# Patient Record
Sex: Male | Born: 1965
Health system: Southern US, Community
[De-identification: ages and names within clinical notes are randomized; demographics above are authoritative.]

## PROBLEM LIST (undated history)

## (undated) DIAGNOSIS — A4902 Methicillin resistant Staphylococcus aureus infection, unspecified site: Secondary | ICD-10-CM

## (undated) DIAGNOSIS — E785 Hyperlipidemia, unspecified: Secondary | ICD-10-CM

## (undated) DIAGNOSIS — I10 Essential (primary) hypertension: Secondary | ICD-10-CM

---

## 2000-08-01 ENCOUNTER — Encounter: Payer: Self-pay | Admitting: Emergency Medicine

## 2000-08-01 ENCOUNTER — Emergency Department (HOSPITAL_COMMUNITY): Admission: EM | Admit: 2000-08-01 | Discharge: 2000-08-01 | Payer: Self-pay | Admitting: Emergency Medicine

## 2005-02-06 ENCOUNTER — Emergency Department (HOSPITAL_COMMUNITY): Admission: EM | Admit: 2005-02-06 | Discharge: 2005-02-06 | Payer: Self-pay | Admitting: Emergency Medicine

## 2005-04-20 ENCOUNTER — Ambulatory Visit: Payer: Self-pay | Admitting: Physical Medicine & Rehabilitation

## 2005-04-20 ENCOUNTER — Inpatient Hospital Stay (HOSPITAL_COMMUNITY): Admission: EM | Admit: 2005-04-20 | Discharge: 2005-04-27 | Payer: Self-pay | Admitting: Emergency Medicine

## 2005-11-06 ENCOUNTER — Ambulatory Visit: Payer: Self-pay | Admitting: Family Medicine

## 2006-02-11 ENCOUNTER — Ambulatory Visit: Payer: Self-pay | Admitting: Family Medicine

## 2006-06-24 ENCOUNTER — Emergency Department (HOSPITAL_COMMUNITY): Admission: EM | Admit: 2006-06-24 | Discharge: 2006-06-24 | Payer: Self-pay | Admitting: Family Medicine

## 2006-06-28 ENCOUNTER — Ambulatory Visit: Payer: Self-pay | Admitting: Family Medicine

## 2006-07-01 ENCOUNTER — Emergency Department: Payer: Self-pay | Admitting: Emergency Medicine

## 2006-07-05 ENCOUNTER — Ambulatory Visit (HOSPITAL_COMMUNITY): Admission: RE | Admit: 2006-07-05 | Discharge: 2006-07-06 | Payer: Self-pay | Admitting: Neurosurgery

## 2006-08-02 ENCOUNTER — Emergency Department (HOSPITAL_COMMUNITY): Admission: EM | Admit: 2006-08-02 | Discharge: 2006-08-02 | Payer: Self-pay | Admitting: Family Medicine

## 2006-09-27 ENCOUNTER — Ambulatory Visit: Payer: Self-pay | Admitting: Psychiatry

## 2006-09-27 ENCOUNTER — Inpatient Hospital Stay (HOSPITAL_COMMUNITY): Admission: RE | Admit: 2006-09-27 | Discharge: 2006-10-01 | Payer: Self-pay | Admitting: Psychiatry

## 2006-10-06 ENCOUNTER — Emergency Department (HOSPITAL_COMMUNITY): Admission: EM | Admit: 2006-10-06 | Discharge: 2006-10-06 | Payer: Self-pay | Admitting: Emergency Medicine

## 2006-12-15 ENCOUNTER — Emergency Department (HOSPITAL_COMMUNITY): Admission: EM | Admit: 2006-12-15 | Discharge: 2006-12-15 | Payer: Self-pay | Admitting: *Deleted

## 2008-06-26 ENCOUNTER — Emergency Department (HOSPITAL_COMMUNITY): Admission: EM | Admit: 2008-06-26 | Discharge: 2008-06-26 | Payer: Self-pay | Admitting: Emergency Medicine

## 2008-07-30 ENCOUNTER — Emergency Department (HOSPITAL_COMMUNITY): Admission: EM | Admit: 2008-07-30 | Discharge: 2008-07-30 | Payer: Self-pay | Admitting: Emergency Medicine

## 2008-07-30 ENCOUNTER — Ambulatory Visit: Payer: Self-pay | Admitting: *Deleted

## 2008-07-30 ENCOUNTER — Inpatient Hospital Stay (HOSPITAL_COMMUNITY): Admission: RE | Admit: 2008-07-30 | Discharge: 2008-08-06 | Payer: Self-pay | Admitting: Psychiatry

## 2009-08-31 ENCOUNTER — Emergency Department (HOSPITAL_COMMUNITY): Admission: EM | Admit: 2009-08-31 | Discharge: 2009-08-31 | Payer: Self-pay | Admitting: Emergency Medicine

## 2010-08-05 LAB — CULTURE, ROUTINE-ABSCESS: Gram Stain: NONE SEEN

## 2010-08-28 LAB — COMPREHENSIVE METABOLIC PANEL
ALT: 42 U/L (ref 0–53)
CO2: 25 mEq/L (ref 19–32)
Calcium: 9.5 mg/dL (ref 8.4–10.5)
Creatinine, Ser: 0.97 mg/dL (ref 0.4–1.5)
GFR calc non Af Amer: >60 mL/min (ref >60)
Glucose, Bld: 94 mg/dL (ref 70–99)
Total Bilirubin: 0.8 mg/dL (ref 0.3–1.2)

## 2010-08-28 LAB — DIFFERENTIAL
Basophils Absolute: 0 10*3/uL (ref 0.0–0.1)
Eosinophils Absolute: 0.1 10*3/uL (ref 0.0–0.7)
Lymphocytes Relative: 24 % (ref 12–46)
Lymphs Abs: 1.9 10*3/uL (ref 0.7–4.0)
Neutrophils Relative %: 67 % (ref 43–77)

## 2010-08-28 LAB — CBC
HCT: 49.9 % (ref 39.0–52.0)
Hemoglobin: 17.3 g/dL — ABNORMAL HIGH (ref 13.0–17.0)
MCHC: 34.7 g/dL (ref 30.0–36.0)
MCV: 95.4 fL (ref 78.0–100.0)
RBC: 5.24 MIL/uL (ref 4.22–5.81)

## 2010-08-28 LAB — RAPID URINE DRUG SCREEN, HOSP PERFORMED
Cocaine: NOT DETECTED
Tetrahydrocannabinol: NOT DETECTED

## 2010-08-28 LAB — URINALYSIS, ROUTINE W REFLEX MICROSCOPIC
Nitrite: NEGATIVE
Urobilinogen, UA: 1 mg/dL (ref 0.0–1.0)
pH: 6 (ref 5.0–8.0)

## 2010-08-28 LAB — PHENYTOIN LEVEL, TOTAL: Phenytoin Lvl: 2.9 ug/mL — ABNORMAL LOW (ref 10.0–20.0)

## 2010-09-02 LAB — GLUCOSE, CAPILLARY: Glucose-Capillary: 140 mg/dL — ABNORMAL HIGH (ref 70–99)

## 2010-09-30 NOTE — H&P (Signed)
NAMEPELLEGRINO, Brandon [name]             ACCOUNT NO.:  [id number]   MEDICAL RECORD NO.:  [id number]          PATIENT TYPE:  IPS   LOCATION:  0506                          [facility]ACILITY[facility]  [facility]   PHYSICIAN:  Geoffery Lyons, M.D.      DATE OF BIRTH:  02/11/1966   DATE OF ADMISSION:  09/27/2006  DATE OF DISCHARGE:                       PSYCHIATRIC ADMISSION ASSESSMENT   IDENTIFYING INFORMATION:  This is a 45 year old, single, white male.  This is a voluntary admission.   HISTORY OF PRESENT ILLNESS:  This 45 year old presented requesting help  with anxiety and depression.  This was becoming worse over the course of  the past couple of weeks.  He had begun drinking alcohol again about 2  weeks ago and drank heavily over this past weekend.  Generally, his  drinking has now escalated to drinking 3-4 glasses of wine about 5 days  a week escalating gradually over the course of the past 3 months.  Prior  to that, he had been abstinent for awhile.  He endorses having a history  of alcohol abuse in the past previously for about 5 years and had gotten  up to drinking about 715 mL 5 days a week.  This was accompanied by  significant depression.  He feels that his most recent relapse 3 months  ago and with increased use of alcohol was due to relationship stress  with the breakup of a relationship with his live in partner deciding to  pursue other arrangements.  He feels that he is coping well with this  now, but his depression is persisting.  He reports general anxiety  having panic attacks several times a week.  Energy is decreased.   REVIEW OF SYSTEMS:  Remarkable for chronic insomnia.  Unable to fall  asleep initially, lying in bed worrying quite a lot, unable to settle  down.  He has been taking Ambien and now up to 20 mg a night which gives  him some relief.  Some financial stress also contributes to his anxiety.  He has endorsed some passive suicidal ideation.  No plan.  Denies any  history of prior  suicide attempts.   PAST PSYCHIATRIC HISTORY:  The patient is currently followed by Dr.  Wynonia Lawman at [facility] and Deanne Coffer who is his  psychotherapist.  This is his first inpatient psychiatric admission.  First admission to [facility].  Reports  suffering with anxiety and depression for about 3 years.  Initially  placed on Wellbutrin which she felt had no effect and subsequently was  given Lexapro.  At that time, he was being cared for by his primary care  physician.  Then started seeing Dr. Wynonia Lawman approximately 1-1/2 years  ago.  Taken off these 2 medications and started on Cymbalta which  initially gave him a positive response and he was able to decrease his  drinking.  He most recently for about 9 month has been on Cymbalta 120  mg daily, and now, the depression is recurring.  He began to feel that  first and subsequently his use of alcohol escalated.  No history of  homicidal  thoughts, violence, seizures, or brain injury.   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  Does report is partner is supportive.  No conflict at home.  No  current legal charges related to alcohol or substance use.   FAMILY HISTORY:  Remarkable for alcoholism, anxiety, and depression in  several members of his mother's side of the family.  Alcohol and drug  history.  Marijuana use about monthly.  In addition to alcohol use as  noted above, denies any other substance use.   MEDICAL HISTORY:  The patient is followed by Dr. Susann Givens.  Medical  problems include hypertension.  Past medical history is remarkable for  back surgery by Dr. Delma Officer in 2006 with  removal of pedicle screws  this past spring 2008.  AXIS III:  Hypertension and chronic insomnia.   MEDICATIONS:  1. Lotrel 5/10 mg daily for his hypertension.  2. Ambien 20 mg p.o. q.h.s.  3. Cymbalta 120 mg daily.   ALLERGIES:  NO KNOWN DRUG ALLERGIES>   REVIEW OF SYSTEMS:  CONSTITUTION:  No fever or chills.  Sleep is  decreased to about 6 hours per night.  Weight is stable.  Appetite  varies.  He is smoking about 1-2 cigarettes per day.  ENDOCRINE:  No  palpitations.  No feeling hot and cold.  No sweats or flushing.  RESPIRATORY:  No shortness of breath.  No post nocturnal dyspnea.  No  cough.  CARDIAC:  No palpitations.  Does feel that he has some rapid  heart rate with his panic attacks.  No fainting.  No blackouts.  No  chest pain.  ABDOMEN:  No loose stools.  No constipation.  Does not use  laxative.  Stools are regular about every other day.  No hematemesis and  no melena.  No changes in color or character.   PHYSICAL EXAMINATION:  GENERAL:  Well-nourished, well-developed male who  is in no acute distress on admission.  VITAL SIGNS:  Afebrile, 5 feet 8 inches tall, 160 pounds, temperature  98.2, pulse 122, respirations 16, blood pressure 136/90.  His CIWA score  was gauged at physical exam as documented in the record.  No tremor  noted.  It is generally unremarkable.  NEURO:  Within normal limits.  Cranial nerves II-XII within normal  limits.  Nonfocal.  Romberg reveals no findings.  No tremor.  No  asterixis.   DIAGNOSTIC STUDIES:  CBC:  WBC 7.9, hemoglobin 17.1, hematocrit 49.5,  platelets 242,000.  Chemistries:  Sodium 136, potassium 3.7, chloride  104, carbon dioxide 24, BUN 8, creatinine 0.89, random glucose 83.  Liver enzymes:  SGOT 14, SGPT 15, alkaline phosphatase 115, and total  bilirubin 1.1.  Calcium 9.  TSH is currently pending.  Routine  urinalysis reveals urine with small amount of leukocyte esterase and  WBCs 7-10 per high power field.  MENTAL STATUS EXAM:   Fully alert male, pleasant, cooperative.  He is in  no distress.  Affect appropriate.  Insight is good.  Speech is normal in  pace, tone, production.  He is fluent, articulate.  Mood is depressed,  anxious.  Thought process is logical and coherent.  No  flight of ideas.  No paranoia, guarding, or ideas of reference.  No signs of psychosis.  No suicidal today but has expressed some suicidal passive kinds of  suicidal thoughts, feeling unable to go on.  Does admit to being quite  anxious.  No plan for suicide.  No homicidal thought.  Cognition is  intact.  He is oriented x3.  Concentration and calculation are intact.  Impulse control and judgment within normal limits.  He was still mildly  tachycardiac this morning and feeling subjectively quite anxious.   AXIS I:  ETOH abuse, rule out dependence, rule out anxiety disorder NOS.  AXIS II:  Deferred.  AXIS III:  Hypertension, chronic insomnia, pyuria NOS.  AXIS IV:  Moderate financial and relationship stress.  AXIS V:  Current 30, past year 70+.   Plan is to voluntarily admit the patient with q.15 minute checks in  place.  We are going to check Librium protocol on him and will start one  now and continue his Cymbalta at this time.  Also are going to give him  some trazodone to help with sleep and will continue at this point his  Ambien 10 mg nightly p.r.n. insomnia.  We will get a urine culture on  him and rule out possible prostatitis.  Estimated length of stay is 5  days.      Margaret A. Scott, N.P.      Geoffery Lyons, M.D.  Electronically Signed    MAS/MEDQ  D:  09/28/2006  T:  09/28/2006  Job:  [id number]
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
## 2010-10-03 NOTE — Discharge Summary (Signed)
Brandon [name], Brandon [name] NO.:  [id number]   MEDICAL RECORD NO.:  [id number]          PATIENT TYPE:  IPS   LOCATION:  0506                          [facility]ILITY[facility]  [facility]   PHYSICIAN:  Geoffery Lyons, M.D.      DATE OF BIRTH:  04-Apr-1966   DATE OF ADMISSION:  07/30/2008  DATE OF DISCHARGE:  08/06/2008                               DISCHARGE SUMMARY   CHIEF COMPLAINT:  This was the first admission to [facility] for this 45 year old male who was admitted with persistent  worsening of depression, anxiety, had relapsed on alcohol use, suicidal  ideations.  He endorsed he stayed sober over a year, started drinking  little by little.  Endorsed he was dealing with illnesses of his  parents, father developed prostate cancer.  Work was okay.  There were  some issues with his partner living alone working as a respiratory  therapist.  Endorsed increased anxiety, panic attacks, increase his  heavy drinking in the last 3 months, wine, liquor, start with small  bottle of wine in the morning.  That is when he felt that he really  needed to something about it.   PAST PSYCHIATRIC HISTORY:  Several years ago saw Dr. Jean Rosenthal in the  [facility].  Alcohol history, as already stated  persistent use of alcohol and no other substances.   MEDICAL HISTORY:  Hypertension, hypercholesterolemia, seizure disorder,  last in February.   MEDICATIONS:  1. Amlodipine benazepril 10-20 one daily.  2. Pravastatin 40 mg per day.  3. Dilantin 300 mg at night.  4. Ambien CR 12.5 mg at bedtime.   PHYSICAL EXAMINATION:  Failed to show any acute findings.   LABORATORY WORK:  Dilantin level 5.4.  UDS positive for benzodiazepines.  CMET within normal limits.  Initial Dilantin level 2.9.   MENTAL STATUS EXAMINATION:  Upon admission revealed alert cooperative  male.  Mood depressed.  Affect depressed, tearful.  Endorsed feeling  overwhelmed, unable to control the stressors.   Understands alcohol was  out of control.  No suicidal ideations.  No homicidal ideas, no  delusions.  No hallucinations.  Cognition well preserved.   ADMITTING DIAGNOSES:  AXIS I:  Alcohol dependence, major depressive  disorder, anxiety disorder not otherwise specified.  AXIS II:  No diagnosis.  AXIS III:  Seizure disorder.  Hypertension, hypercholesterolemia.  AXIS IV:  Moderate.  AXIS V:  On admission 35-40.  Highest GAF in the last year 70.   COURSE IN THE HOSPITAL:  Was admitted, started in individual and group  psychotherapy.  We pursued detox with Librium.  He did say he used to  take Cymbalta, Wellbutrin and had taken Lexapro.  Had a DUI last month.  This is his second.  Not sure how this is going to play out.  March 17,  having a difficult time with sleep.  Did admit to mood dysregulation.  Claims that he even when he is sober, he experienced mood fluctuation,  more so towards the depressed side.  Did admit that this state of mind  may contribute to his drinking.  Discussed  options.  We decided to go  for Lamictal and Seroquel.  On the Seroquel, he endorsed he is sleeping  better, was wanting to look into considering rehab.  There was a family  session with the partner.  He was reluctant because his partner did not  feel that he needed it at this particular time.  The patient admitted  having an excellent support system.  March 19 he continued to get  better.  Sleep improved.  Sleeping a long time.  We continued and  completed detox.  We continued to stabilize with medications.  The next  48 hours he continued to improve.  There was some anxiety, anticipating  discharge.  March 22 was in full contact reality and there were no  active suicidal ideas, no homicidal ideation, hallucinations or  delusions.  Fully detoxed.  Tolerated the medications well.  We went  ahead and discharged to outpatient followup.   DISCHARGE DIAGNOSES:  AXIS I:  Major depressive disorder.  Mood  disorder  NOS, alcohol dependence.  AXIS II:  No diagnosis.  Hypercholesterolemia, seizure disorder,  hypertension.  AXIS IV:  Moderate.  AXIS V:  On discharge 50-55.   DISCHARGE MEDICATIONS:  1. Lamictal 25 mg per day with plans to increase to 50.  2. Cymbalta 30 mg per day.  3. Dilantin 2 mg twice a day.  4. Norvasc 10 mg at night.  5. Pravastatin 40 mg at bedtime.  6. Ambien CR 12.5 mg at bedtime.  7. Revia 50 mg 1/2 daily times 6 days then one daily.  8. Seroquel 200 mg at bedtime.   FOLLOWUP:  [facility].      Geoffery Lyons, M.D.  Electronically Signed     IL/MEDQ  D:  08/28/2008  T:  08/28/2008  Job:  [id number]
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
## 2010-10-03 NOTE — Discharge Summary (Signed)
Brandon [name], Brandon [name] NO.:  [id number]   MEDICAL RECORD NO.:  [id number]          PATIENT TYPE:  IPS   LOCATION:  0506                          FACILITY:  [facility]   PHYSICIAN:  Geoffery Lyons, M.D.      DATE OF BIRTH:  07-21-1965   DATE OF ADMISSION:  09/27/2006  DATE OF DISCHARGE:  10/01/2006                               DISCHARGE SUMMARY   CHIEF COMPLAINT AND PRESENT ILLNESS:  This was the first admission to  [facility] for this 45 year old single white male  voluntarily admitted.  He requested help with anxiety and depression,  worse over the past couple of weeks before this admission.  Began  drinking alcohol again about two weeks prior to this admission.  Drank  heavily over the past weekend.  Drinking has escalated, drinking 3-4  glasses of wine about five days a week, escalating gradually over the  past three months.  Past history of alcohol abuse for about five years.  Gotten to drink about a liter 750 mL five days a week.  This was  accompanied with significant depression.  There was some relationship  stress with the breakup of a relationship with his live-in partner.  Decided to pursue other arrangements.  Although he endorsed that he is  coping better with this, the depression now continues.  Endorsed  generalized anxiety, having panic attacks several times a week.   PAST PSYCHIATRIC HISTORY:  Being seen by Dr. Wynonia Lawman at [facility] and Deanne Coffer.  First inpatient.  Endorsed anxiety and  depression for about three years.  Initially Wellbutrin.  Subsequently  given Lexapro.  He was apparently then taken off these two medications.  Started on Cymbalta which initially gave him a positive response and he  was able to decrease his drinking.  For the last nine months, Cymbalta  120 mg.  Now the depression as recurring.   ALCOHOL/DRUG HISTORY:  As already stated, resumed use of alcohol with  escalation of the amount he is  drinking.  No other substances.   MEDICAL HISTORY:  Hypertension, status post back surgery in 2006 and  removal of pedicle screws in spring of 2008.   MEDICATIONS:  Lotrel 5/10 mg daily for his high blood pressure, Ambien  20 mg at night, Cymbalta 120 mg per day.   PHYSICAL EXAMINATION:  Performed and failed to show any acute findings.   LABORATORY DATA:  CBC revealed white blood cells 7.9, hemoglobin 17.1.  Sodium 136, potassium 3.7, BUN 8, creatinine 0.89, glucose 33.  SGOT 14,  SGPT 15, total bilirubin 1.1.   MENTAL STATUS EXAM:  Fully alert, pleasant, cooperative male in no  distress.  Affect broad.  Thought processes logical, coherent and  relevant.  Speech was normal in pace, tone and production, fluent,  articulate.  Mood depressed, anxious.  No delusions.  No suicidal or  homicidal ideation.  Wanting to be detoxed and get healthier.  No  hallucinations.  Cognition well-preserved.   ADMISSION DIAGNOSES:  AXIS I:  Alcohol dependence.  Rule out substance-  induced mood disorder.  Anxiety disorder  not otherwise specified.  AXIS II:  No diagnosis.  AXIS III:  Hypertension, chronic hypertension.  AXIS IV:  Moderate.  AXIS V:  GAF upon admission 38; highest GAF in the last year 70.   HOSPITAL COURSE:  He was admitted.  He was started in individual and  group psychotherapy.  He was detoxified with Librium.  He was maintained  on Ambien for sleep.  Then, he was started on trazodone.  That was not  effective so we switched to Remeron.  Endorsed anxiety and depression  over a year, getting worse.  Has had episodes of some social anxiety,  panic.  Stopped drinking regularly and then drank a lot during the  weekend.  Got really depressed.  Three years prior to this admission,  broke up a relationship of 13 years.  Drinking a bottle of wine five  nights a week by himself.  This increased the depression.  Works as a  Buyer, retail.  Has been in another relationship for the  last  year.  On Sep 29, 2006, very upset because heard that his significant  other wanted to break off the relationship because the significant other  drinks and feels that he is affecting him by drinking in front of him  and his significant other is not wanting to quit drinking.  He does not  want to break the relationship.  He was visibly upset when talking about  the possibility of breaking up with his current partner.  Committed to  abstinence.  There was a session with his boyfriend that went well.  He  was encouraged and optimistic about it.  Endorsed he was feeling better,  clear-headed.  Normal sleep pattern was restored.  Was going to pursue  outpatient treatment with the boyfriend once he were discharge.  Committed to his recovery.  On Oct 01, 2006, he was fully detoxed, in  full contact with reality.  Feeling much better.  No active suicidal or  homicidal ideation.  No hallucinations.  No delusions.  Willing to  pursue medications and work on a recovery plan.   DISCHARGE DIAGNOSES:  AXIS I:  Alcohol dependence.  Depressive disorder  not otherwise specified.  Anxiety disorder not otherwise specified.  AXIS II:  No diagnosis.  AXIS III:  Arterial hypertension.  AXIS IV: Moderate.  AXIS V:  GAF upon discharge 55-60.   DISCHARGE MEDICATIONS:  1. Cymbalta 120 mg per day.  2. Lotrel 5/10 mg, 1 daily.  3. Antabuse 250 mg, 1 daily.  4. Remeron SolTab 45 mg at bedtime.   FOLLOWUP:  Donnie Aho and Ardelle Lesches at [facility].      Geoffery Lyons, M.D.  Electronically Signed     IL/MEDQ  D:  10/26/2006  T:  10/26/2006  Job:  [id number]

## 2011-03-02 LAB — I-STAT 8, (EC8 V) (CONVERTED LAB)
Acid-Base Excess: 2
Bicarbonate: 28.3 — ABNORMAL HIGH
Potassium: 3.7
TCO2: 30
pCO2, Ven: 49
pH, Ven: 7.37 — ABNORMAL HIGH

## 2011-03-02 LAB — PHENYTOIN LEVEL, TOTAL: Phenytoin Lvl: 2.6 — ABNORMAL LOW

## 2011-03-02 LAB — POCT CARDIAC MARKERS
CKMB, poc: <1 — ABNORMAL LOW
Myoglobin, poc: 44.6
Operator id: 279831

## 2011-04-23 ENCOUNTER — Emergency Department: Payer: Self-pay | Admitting: Emergency Medicine

## 2011-11-13 ENCOUNTER — Ambulatory Visit: Payer: Self-pay

## 2011-11-13 LAB — COMPREHENSIVE METABOLIC PANEL
Alkaline Phosphatase: 99 U/L (ref 50–136)
Bilirubin,Total: 0.5 mg/dL (ref 0.2–1.0)
Chloride: 106 mmol/L (ref 98–107)
Creatinine: 1.02 mg/dL (ref 0.60–1.30)
EGFR (African American): >60
Osmolality: 282 (ref 275–301)
Potassium: 3.9 mmol/L (ref 3.5–5.1)
SGOT(AST): 20 U/L (ref 15–37)
Sodium: 141 mmol/L (ref 136–145)
Total Protein: 7.6 g/dL (ref 6.4–8.2)

## 2011-11-13 LAB — LIPID PANEL: Cholesterol: 186 mg/dL (ref 0–200)

## 2012-05-14 ENCOUNTER — Other Ambulatory Visit: Payer: Self-pay

## 2012-05-14 LAB — COMPREHENSIVE METABOLIC PANEL
Albumin: 4 g/dL (ref 3.4–5.0)
Anion Gap: 9 (ref 7–16)
Calcium, Total: 8.8 mg/dL (ref 8.5–10.1)
Chloride: 109 mmol/L — ABNORMAL HIGH (ref 98–107)
EGFR (African American): >60
Glucose: 108 mg/dL — ABNORMAL HIGH (ref 65–99)
Potassium: 3.1 mmol/L — ABNORMAL LOW (ref 3.5–5.1)
SGOT(AST): 20 U/L (ref 15–37)
SGPT (ALT): 27 U/L (ref 12–78)

## 2012-05-14 LAB — LIPID PANEL
Ldl Cholesterol, Calc: 156 mg/dL — ABNORMAL HIGH (ref 0–100)
VLDL Cholesterol, Calc: 27 mg/dL (ref 5–40)

## 2012-05-14 LAB — TSH: Thyroid Stimulating Horm: 2.23 u[IU]/mL

## 2012-05-14 LAB — HEMOGLOBIN A1C: Hemoglobin A1C: 5.6 % (ref 4.2–6.3)

## 2012-12-10 ENCOUNTER — Other Ambulatory Visit: Payer: Self-pay

## 2012-12-10 LAB — COMPREHENSIVE METABOLIC PANEL
Alkaline Phosphatase: 122 U/L (ref 50–136)
BUN: 11 mg/dL (ref 7–18)
Bilirubin,Total: 0.6 mg/dL (ref 0.2–1.0)
Chloride: 109 mmol/L — ABNORMAL HIGH (ref 98–107)
Co2: 25 mmol/L (ref 21–32)
EGFR (Non-African Amer.): >60
Glucose: 108 mg/dL — ABNORMAL HIGH (ref 65–99)
Osmolality: 281 (ref 275–301)
SGOT(AST): 21 U/L (ref 15–37)
Sodium: 141 mmol/L (ref 136–145)

## 2012-12-10 LAB — LIPID PANEL
Cholesterol: 220 mg/dL — ABNORMAL HIGH (ref 0–200)
HDL Cholesterol: 40 mg/dL (ref 40–60)
VLDL Cholesterol, Calc: 27 mg/dL (ref 5–40)

## 2014-11-16 ENCOUNTER — Other Ambulatory Visit
Admission: RE | Admit: 2014-11-16 | Discharge: 2014-11-16 | Disposition: A | Discharge status: Home / Self Care | Payer: BLUE CROSS/BLUE SHIELD | Source: Ambulatory Visit | Attending: Addiction Psychiatry | Admitting: Addiction Psychiatry

## 2014-11-16 DIAGNOSIS — F1010 Alcohol abuse, uncomplicated: Secondary | ICD-10-CM | POA: Diagnosis present

## 2014-11-16 LAB — COMPREHENSIVE METABOLIC PANEL
ALBUMIN: 4.2 g/dL (ref 3.5–5.0)
ALT: 24 U/L (ref 17–63)
AST: 22 U/L (ref 15–41)
Alkaline Phosphatase: 83 U/L (ref 38–126)
Anion gap: 9 (ref 5–15)
BILIRUBIN TOTAL: 0.7 mg/dL (ref 0.3–1.2)
BUN: 13 mg/dL (ref 6–20)
CO2: 23 mmol/L (ref 22–32)
CREATININE: 0.98 mg/dL (ref 0.61–1.24)
Calcium: 9.1 mg/dL (ref 8.9–10.3)
Chloride: 108 mmol/L (ref 101–111)
GFR calc Af Amer: >60 mL/min (ref >60)
GFR calc non Af Amer: >60 mL/min (ref >60)
GLUCOSE: 106 mg/dL — AB (ref 65–99)
Potassium: 3.9 mmol/L (ref 3.5–5.1)
Sodium: 140 mmol/L (ref 135–145)
TOTAL PROTEIN: 7.2 g/dL (ref 6.5–8.1)

## 2014-11-16 LAB — LIPID PANEL
CHOL/HDL RATIO: 4.2 ratio
CHOLESTEROL: 164 mg/dL (ref 0–200)
HDL: 39 mg/dL — AB (ref >40)
LDL Cholesterol: 106 mg/dL — ABNORMAL HIGH (ref 0–99)
Triglycerides: 96 mg/dL (ref <150)
VLDL: 19 mg/dL (ref 0–40)

## 2014-11-16 LAB — CBC
HCT: 48.5 % (ref 40.0–52.0)
HEMOGLOBIN: 16.6 g/dL (ref 13.0–18.0)
MCH: 31.9 pg (ref 26.0–34.0)
MCHC: 34.1 g/dL (ref 32.0–36.0)
MCV: 93.5 fL (ref 80.0–100.0)
Platelets: 171 10*3/uL (ref 150–440)
RBC: 5.19 MIL/uL (ref 4.40–5.90)
RDW: 12.8 % (ref 11.5–14.5)
WBC: 7.9 10*3/uL (ref 3.8–10.6)

## 2014-11-16 LAB — TSH: TSH: 1.771 u[IU]/mL (ref 0.350–4.500)

## 2014-11-17 LAB — TESTOSTERONE: TESTOSTERONE: 455 ng/dL (ref 348–1197)

## 2014-11-17 LAB — VITAMIN D 25 HYDROXY (VIT D DEFICIENCY, FRACTURES): VIT D 25 HYDROXY: 45.5 ng/mL (ref 30.0–100.0)

## 2019-03-09 DIAGNOSIS — Z1329 Encounter for screening for other suspected endocrine disorder: Secondary | ICD-10-CM | POA: Diagnosis not present

## 2019-03-09 DIAGNOSIS — R7303 Prediabetes: Secondary | ICD-10-CM | POA: Diagnosis not present

## 2019-03-09 DIAGNOSIS — Z131 Encounter for screening for diabetes mellitus: Secondary | ICD-10-CM | POA: Diagnosis not present

## 2019-03-09 DIAGNOSIS — F419 Anxiety disorder, unspecified: Secondary | ICD-10-CM | POA: Diagnosis not present

## 2019-03-09 DIAGNOSIS — I10 Essential (primary) hypertension: Secondary | ICD-10-CM | POA: Diagnosis not present

## 2019-03-09 DIAGNOSIS — Z1389 Encounter for screening for other disorder: Secondary | ICD-10-CM | POA: Diagnosis not present

## 2019-03-09 DIAGNOSIS — Z01021 Encounter for examination of eyes and vision following failed vision screening with abnormal findings: Secondary | ICD-10-CM | POA: Diagnosis not present

## 2019-03-09 DIAGNOSIS — Z0001 Encounter for general adult medical examination with abnormal findings: Secondary | ICD-10-CM | POA: Diagnosis not present

## 2019-03-09 DIAGNOSIS — I429 Cardiomyopathy, unspecified: Secondary | ICD-10-CM | POA: Diagnosis not present

## 2019-03-09 DIAGNOSIS — Z136 Encounter for screening for cardiovascular disorders: Secondary | ICD-10-CM | POA: Diagnosis not present

## 2019-03-09 DIAGNOSIS — Z01118 Encounter for examination of ears and hearing with other abnormal findings: Secondary | ICD-10-CM | POA: Diagnosis not present

## 2019-03-09 DIAGNOSIS — G4733 Obstructive sleep apnea (adult) (pediatric): Secondary | ICD-10-CM | POA: Diagnosis not present

## 2019-05-16 MED FILL — ATORVASTATIN 10 MG TABLET: 10 | 30 days supply | Qty: 30 | Fill #0

## 2019-05-16 MED FILL — CARTIA XT 120 MG CAPSULE: 120 | 30 days supply | Qty: 30 | Fill #0

## 2019-05-16 MED FILL — OLMESARTAN-HCTZ 20-12.5 MG: 20-12.5 | 30 days supply | Qty: 30 | Fill #0

## 2019-05-16 MED FILL — QUETIAPINE FUMARATE 100 MG: 100 | 30 days supply | Qty: 30 | Fill #0

## 2019-06-12 MED FILL — VIT D2 1.25 MG (50,000 UNIT: 1.25 MG | 84 days supply | Qty: 24 | Fill #0

## 2019-06-12 MED FILL — OLMESARTAN-HCTZ 20-12.5 MG: 20-12.5 | 30 days supply | Qty: 30 | Fill #1

## 2019-06-12 MED FILL — ATORVASTATIN 10 MG TABLET: 10 | 30 days supply | Qty: 30 | Fill #1

## 2019-06-12 MED FILL — CARTIA XT 120 MG CAPSULE: 120 | 30 days supply | Qty: 30 | Fill #1

## 2019-06-13 DIAGNOSIS — I429 Cardiomyopathy, unspecified: Secondary | ICD-10-CM | POA: Diagnosis not present

## 2019-06-13 DIAGNOSIS — F419 Anxiety disorder, unspecified: Secondary | ICD-10-CM | POA: Diagnosis not present

## 2019-06-13 DIAGNOSIS — E7800 Pure hypercholesterolemia, unspecified: Secondary | ICD-10-CM | POA: Diagnosis not present

## 2019-06-13 DIAGNOSIS — R7303 Prediabetes: Secondary | ICD-10-CM | POA: Diagnosis not present

## 2019-06-13 DIAGNOSIS — I10 Essential (primary) hypertension: Secondary | ICD-10-CM | POA: Diagnosis not present

## 2019-06-13 DIAGNOSIS — G4733 Obstructive sleep apnea (adult) (pediatric): Secondary | ICD-10-CM | POA: Diagnosis not present

## 2019-06-13 MED FILL — QUETIAPINE FUMARATE 100 MG: 100 | 30 days supply | Qty: 30 | Fill #0

## 2019-07-18 MED FILL — QUETIAPINE FUMARATE 100 MG: 100 | 30 days supply | Qty: 30 | Fill #1

## 2019-07-18 MED FILL — OLMESARTAN-HCTZ 20-12.5 MG: 20-12.5 | 30 days supply | Qty: 30 | Fill #2

## 2019-07-18 MED FILL — ATORVASTATIN 10 MG TABLET: 10 | 30 days supply | Qty: 30 | Fill #2

## 2019-07-18 MED FILL — CARTIA XT 120 MG CP24: 120 | 30 days supply | Qty: 30 | Fill #2

## 2019-08-01 ENCOUNTER — Encounter (HOSPITAL_COMMUNITY): Payer: Self-pay

## 2019-08-01 ENCOUNTER — Ambulatory Visit (INDEPENDENT_AMBULATORY_CARE_PROVIDER_SITE_OTHER): Payer: 59

## 2019-08-01 ENCOUNTER — Ambulatory Visit (HOSPITAL_COMMUNITY)
Admission: EM | Admit: 2019-08-01 | Discharge: 2019-08-01 | Disposition: A | Discharge status: Home / Self Care | Payer: 59

## 2019-08-01 ENCOUNTER — Other Ambulatory Visit: Payer: Self-pay

## 2019-08-01 DIAGNOSIS — M25562 Pain in left knee: Secondary | ICD-10-CM

## 2019-08-01 HISTORY — DX: Hyperlipidemia, unspecified: E78.5

## 2019-08-01 HISTORY — DX: Essential (primary) hypertension: I10

## 2019-08-01 MED ORDER — MELOXICAM 15 MG PO TABS: 15.0000 mg | ORAL_TABLET | Freq: Every day | ORAL | 0 refills | Status: DC

## 2019-08-01 MED FILL — MELOXICAM 15 MG TABLET: 15 | 15 days supply | Qty: 15 | Fill #0

## 2019-08-01 NOTE — ED Provider Notes (Signed)
[place]    CSN: [id number] Arrival date & time: 08/01/19  1308      History   Chief Complaint Chief Complaint  Patient presents with  . Knee Pain    HPI Brandon [name] is a 53 y.o. male.   Patient is a 54 year old male past medical history of hyperlipidemia and hypertension.  He presents today with sharp left medial knee pain that is worsening with bearing weight and certain movements like walking uphill.  This started yesterday.  Denies any injuries prior to this starting.  No swelling, erythema.  Reporting mild instability.  He did purchase a knee sleeve which seems to help some.  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  No numbness, tingling or loss sensation.  ROS per HPI      Past Medical History:  Diagnosis Date  . Hyperlipidemia   . Hypertension     There are no problems to display for this patient.   Past Surgical History:  Procedure Laterality Date  . BACK SURGERY         Home Medications    Prior to Admission medications   Medication Sig Start Date End Date Taking? Authorizing Provider  diltiazem (CARDIZEM) 120 MG tablet Take 120 mg by mouth every morning.   Yes [provider]  atorvastatin (LIPITOR) 10 MG tablet Take by mouth.    [provider]  CARTIA XT 120 MG 24 hr capsule Take 120 mg by mouth daily. 07/18/19   [provider]  meloxicam (MOBIC) 15 MG tablet Take 1 tablet (15 mg total) by mouth daily. 08/01/19   Kiosha Buchan, Tressia Miners A, NP  olmesartan-hydrochlorothiazide (BENICAR HCT) 20-12.5 MG tablet Take by mouth.    [provider]  QUEtiapine (SEROQUEL) 100 MG tablet Take 100 mg by mouth at bedtime. 07/18/19   [provider]  VITAMIN D, CHOLECALCIFEROL, PO Take by mouth.    [provider]  Vitamin D, Ergocalciferol, (DRISDOL) 1.25 MG (50000 UNIT) CAPS capsule Take 50,000 Units by mouth 2 (two) times a week. 03/17/19   [provider]    Family  History Family History  Problem Relation Age of Onset  . Seizures Father     Social History Social History   Tobacco Use  . Smoking status: Former Research scientist (life sciences)  . Smokeless tobacco: Never Used  Substance Use Topics  . Alcohol use: Never  . Drug use: Never     Allergies   Patient has no known allergies.   Review of Systems Review of Systems   Physical Exam Triage Vital Signs ED Triage Vitals  Enc Vitals Group     BP 08/01/19 1342 136/84     Pulse Rate 08/01/19 1342 97     Resp 08/01/19 1342 18     Temp 08/01/19 1342 98.7 F (37.1 C)     Temp Source 08/01/19 1342 Oral     SpO2 08/01/19 1342 97 %     Weight 08/01/19 1343 185 lb (83.9 kg)     Height 08/01/19 1343 5\' 8"  (1.727 m)     Head Circumference --      Peak Flow --      Pain Score 08/01/19 1343 0     Pain Loc --      Pain Edu? --      Excl. in [place]? --    No data found.  Updated Vital Signs BP 136/84 (BP Location: Left Arm)   Pulse 97  Temp 98.7 F (37.1 C) (Oral)   Resp 18   Ht 5\' 8"  (1.727 m)   Wt 185 lb (83.9 kg)   SpO2 97%   BMI 28.13 kg/m   Visual Acuity Right Eye Distance:   Left Eye Distance:   Bilateral Distance:    Right Eye Near:   Left Eye Near:    Bilateral Near:     Physical Exam Vitals and nursing note reviewed.  Constitutional:      Appearance: Normal appearance.  HENT:     Head: Normocephalic and atraumatic.     Nose: Nose normal.  Eyes:     Conjunctiva/sclera: Conjunctivae normal.  Pulmonary:     Effort: Pulmonary effort is normal.  Abdominal:     Palpations: Abdomen is soft.     Tenderness: There is no abdominal tenderness.  Musculoskeletal:        General: Normal range of motion.     Cervical back: Normal range of motion.     Left knee: Crepitus present. Tenderness present. No LCL laxity, MCL laxity, ACL laxity or PCL laxity.    Instability Tests: Positive medial McMurray test. Negative lateral McMurray test.       Legs:     Comments: To palpable pain. No  swelling. Pain with internal rotation of the knee.   Skin:    General: Skin is warm and dry.  Neurological:     Mental Status: He is alert.  Psychiatric:        Mood and Affect: Mood normal.      UC Treatments / Results  Labs (all labs ordered are listed, but only abnormal results are displayed) Labs Reviewed - No data to display  EKG   Radiology DG Knee Complete 4 Views Left  Result Date: 08/01/2019 CLINICAL DATA:  Knee pain, instability, no known injury EXAM: LEFT KNEE - COMPLETE 4+ VIEW COMPARISON:  None. FINDINGS: No evidence of fracture, dislocation, or joint effusion. No evidence of arthropathy or other focal bone abnormality. Soft tissues are unremarkable. IMPRESSION: No fracture or dislocation of the left knee. Joint spaces are preserved. Electronically Signed   By: 08/03/2019 M.D.   On: 08/01/2019 14:48    Procedures Procedures (including critical care time)  Medications Ordered in UC Medications - No data to display  Initial Impression / Assessment and Plan / UC Course  I have reviewed the triage vital signs and the nursing notes.  Pertinent labs & imaging results that were available during my care of the patient were reviewed by me and considered in my medical decision making (see chart for details).     Knee pain-x-ray without any acute abnormalities. Most likely meniscus issue Recommend follow-up with orthopedic if this continues or worsens. Rest, ice, elevate and meloxicam daily for pain, inflammation   Final Clinical Impressions(s) / UC Diagnoses   Final diagnoses:  Acute pain of left knee     Discharge Instructions     Your x ray was normal I think this is meniscus problem You need to follow up with ortho soon if this worsens  Rest, ice, elevate Knee brace Meloxicam for pain.  Follow up as needed for continued or worsening symptoms     ED Prescriptions    Medication Sig Dispense Auth. Provider   meloxicam (MOBIC) 15 MG tablet Take 1  tablet (15 mg total) by mouth daily. 15 tablet Nardos Putnam A, NP     PDMP not reviewed this encounter.   08/03/2019, NP 08/02/19  0941  

## 2019-08-01 NOTE — ED Triage Notes (Signed)
Pt c/o sharp left knee pain that started yesterday. Pt denies injuring knee. No edema noted of left knee. Pt has 2+ left pedal pulse, cap refill less than 3 sec, warm to touch. Pt limped to exam room.

## 2019-08-01 NOTE — Discharge Instructions (Signed)
Your x ray was normal I think this is meniscus problem You need to follow up with ortho soon if this worsens  Rest, ice, elevate Knee brace Meloxicam for pain.  Follow up as needed for continued or worsening symptoms

## 2019-08-02 DIAGNOSIS — S83242A Other tear of medial meniscus, current injury, left knee, initial encounter: Secondary | ICD-10-CM | POA: Diagnosis not present

## 2019-08-04 DIAGNOSIS — M25562 Pain in left knee: Secondary | ICD-10-CM | POA: Diagnosis not present

## 2019-08-07 ENCOUNTER — Other Ambulatory Visit: Payer: Self-pay

## 2019-08-07 ENCOUNTER — Encounter (HOSPITAL_BASED_OUTPATIENT_CLINIC_OR_DEPARTMENT_OTHER): Payer: Self-pay | Admitting: Orthopedic Surgery

## 2019-08-07 DIAGNOSIS — S83242D Other tear of medial meniscus, current injury, left knee, subsequent encounter: Secondary | ICD-10-CM | POA: Diagnosis not present

## 2019-08-08 ENCOUNTER — Encounter (HOSPITAL_BASED_OUTPATIENT_CLINIC_OR_DEPARTMENT_OTHER)
Admission: RE | Admit: 2019-08-08 | Discharge: 2019-08-08 | Disposition: A | Discharge status: Home / Self Care | Payer: 59 | Source: Ambulatory Visit | Attending: Orthopedic Surgery | Admitting: Orthopedic Surgery

## 2019-08-08 ENCOUNTER — Other Ambulatory Visit (HOSPITAL_COMMUNITY)
Admission: RE | Admit: 2019-08-08 | Discharge: 2019-08-08 | Disposition: A | Discharge status: Home / Self Care | Payer: 59 | Source: Ambulatory Visit | Attending: Orthopedic Surgery | Admitting: Orthopedic Surgery

## 2019-08-08 DIAGNOSIS — S83242A Other tear of medial meniscus, current injury, left knee, initial encounter: Secondary | ICD-10-CM | POA: Diagnosis not present

## 2019-08-08 DIAGNOSIS — I10 Essential (primary) hypertension: Secondary | ICD-10-CM | POA: Diagnosis not present

## 2019-08-08 DIAGNOSIS — X58XXXA Exposure to other specified factors, initial encounter: Secondary | ICD-10-CM | POA: Diagnosis not present

## 2019-08-08 DIAGNOSIS — E785 Hyperlipidemia, unspecified: Secondary | ICD-10-CM | POA: Diagnosis not present

## 2019-08-08 DIAGNOSIS — Z20822 Contact with and (suspected) exposure to covid-19: Secondary | ICD-10-CM | POA: Diagnosis not present

## 2019-08-08 DIAGNOSIS — Z8249 Family history of ischemic heart disease and other diseases of the circulatory system: Secondary | ICD-10-CM | POA: Diagnosis not present

## 2019-08-08 DIAGNOSIS — Z87891 Personal history of nicotine dependence: Secondary | ICD-10-CM | POA: Diagnosis not present

## 2019-08-08 LAB — BASIC METABOLIC PANEL
Anion gap: 11 (ref 5–15)
BUN: 10 mg/dL (ref 6–20)
CO2: 21 mmol/L — ABNORMAL LOW (ref 22–32)
Calcium: 9.1 mg/dL (ref 8.9–10.3)
Chloride: 108 mmol/L (ref 98–111)
Creatinine, Ser: 0.84 mg/dL (ref 0.61–1.24)
GFR calc Af Amer: >60 mL/min (ref >60)
GFR calc non Af Amer: >60 mL/min (ref >60)
Glucose, Bld: 111 mg/dL — ABNORMAL HIGH (ref 70–99)
Potassium: 3.6 mmol/L (ref 3.5–5.1)
Sodium: 140 mmol/L (ref 135–145)

## 2019-08-08 LAB — SARS CORONAVIRUS 2 (TAT 6-24 HRS): SARS Coronavirus 2: NEGATIVE

## 2019-08-09 NOTE — H&P (Signed)
[facility] [address]   [address] [place] [address] [phone] A Division of [facility]  RE: BRINDEN, KINCHELOE   [id number]   [birth year] INITIAL EVALUATION 08-02-19 SUBJECTIVE: Terrelle is a 54 year-old male who presents for evaluation of left knee pain.  Pain has been present for the last 3-4 days.  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  Pain is located along the medial joint line.  It does not radiate.  He denies any numbness or tingling.  He gets frequent locking and catching, as well as sensation that his knee is about to give out.  He is otherwise healthy other than a history of hyperlipidemia for which he takes Atorvastatin.  Family history is positive for hypertension.  He is a nonsmoker.  No allergies. Review of systems: Negative other than what is mentioned in the HPI.   OBJECTIVE: Well appearing and in no acute distress.  Height: 5?8.  Weight: 185 pounds.  Examination of the left knee reveals no bruising.  No swelling.  No deformity.  Tenderness along the medial joint line.  Full range of motion.  5/5 strength.  Positive McMurray.  Knee is stable with varus and valgus stress.    X-RAYS: None today.  X-rays obtained at [facility] showing no acute bony abnormalities.  No arthritis.  ASSESSMENT: Left knee meniscus tear.  PLAN: Given his mechanical symptoms we will proceed with getting an MRI of the knee to evaluate for a meniscus tear.  Patient will follow up after the MRI to discuss the results.    Estell Harpin, M.D.  Dictated by: Christena Deem, M.D.  Electronically verified by Estell Harpin, M.D. JSK(KA):jjh Cc:  Jackie Plum MD  fax [phone]  D 08-03-19 T 08-07-19   RE: DELSHAWN, STECH   [phone]   [birth year] PROGRESS NOTE: 08-07-19 Danielle returns in follow up.  MRI of the left knee shows a medial meniscus tear.  I discussed these MRI findings.  With his symptoms all consistent with meniscal pathology.  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  We discussed therapeutic options in particular the merits of knee arthroscopy.  To that end, surgical consultation with Dr. Eulah Pont.  I discussed the risks, benefits, potential complications, post-op care, recovery and rehab all at length with him.  He agrees and understands.  Post-op follow up with Dr. Eulah Pont.  Estell Harpin, M.D.  Electronically verified by Estell Harpin, M.D. JSK:jjh D 08-07-19 T 08-08-19

## 2019-08-09 NOTE — Progress Notes (Signed)

## 2019-08-11 ENCOUNTER — Other Ambulatory Visit: Payer: Self-pay

## 2019-08-11 ENCOUNTER — Ambulatory Visit (HOSPITAL_BASED_OUTPATIENT_CLINIC_OR_DEPARTMENT_OTHER): Payer: 59 | Admitting: Certified Registered"

## 2019-08-11 ENCOUNTER — Ambulatory Visit (HOSPITAL_BASED_OUTPATIENT_CLINIC_OR_DEPARTMENT_OTHER)
Admission: RE | Admit: 2019-08-11 | Discharge: 2019-08-11 | Disposition: A | Discharge status: Home / Self Care | Payer: 59 | Attending: Orthopedic Surgery | Admitting: Orthopedic Surgery

## 2019-08-11 ENCOUNTER — Encounter (HOSPITAL_BASED_OUTPATIENT_CLINIC_OR_DEPARTMENT_OTHER): Payer: Self-pay | Admitting: Orthopedic Surgery

## 2019-08-11 ENCOUNTER — Encounter (HOSPITAL_BASED_OUTPATIENT_CLINIC_OR_DEPARTMENT_OTHER)
Admission: RE | Disposition: A | Discharge status: Home / Self Care | Payer: Self-pay | Source: Home / Self Care | Attending: Orthopedic Surgery

## 2019-08-11 DIAGNOSIS — Z87891 Personal history of nicotine dependence: Secondary | ICD-10-CM | POA: Diagnosis not present

## 2019-08-11 DIAGNOSIS — I10 Essential (primary) hypertension: Secondary | ICD-10-CM | POA: Diagnosis not present

## 2019-08-11 DIAGNOSIS — E785 Hyperlipidemia, unspecified: Secondary | ICD-10-CM | POA: Insufficient documentation

## 2019-08-11 DIAGNOSIS — Z8249 Family history of ischemic heart disease and other diseases of the circulatory system: Secondary | ICD-10-CM | POA: Diagnosis not present

## 2019-08-11 DIAGNOSIS — Z20822 Contact with and (suspected) exposure to covid-19: Secondary | ICD-10-CM | POA: Diagnosis not present

## 2019-08-11 DIAGNOSIS — S83242A Other tear of medial meniscus, current injury, left knee, initial encounter: Secondary | ICD-10-CM | POA: Insufficient documentation

## 2019-08-11 DIAGNOSIS — X58XXXA Exposure to other specified factors, initial encounter: Secondary | ICD-10-CM | POA: Insufficient documentation

## 2019-08-11 HISTORY — PX: KNEE ARTHROSCOPY WITH MEDIAL MENISECTOMY: SHX5651

## 2019-08-11 HISTORY — DX: Methicillin resistant Staphylococcus aureus infection, unspecified site: A49.02

## 2019-08-11 SURGERY — ARTHROSCOPY, KNEE, WITH MEDIAL MENISCECTOMY
Anesthesia: General | Site: Knee | Laterality: Left

## 2019-08-11 MED ORDER — FENTANYL CITRATE (PF) 100 MCG/2ML IJ SOLN
50.0000 ug | INTRAMUSCULAR | Status: DC | PRN
  Administered 2019-08-11: 100 ug via INTRAVENOUS

## 2019-08-11 MED ORDER — PROPOFOL 500 MG/50ML IV EMUL
INTRAVENOUS | Status: AC
  Filled 2019-08-11: qty 50

## 2019-08-11 MED ORDER — ACETAMINOPHEN 500 MG PO TABS
1000.0000 mg | ORAL_TABLET | Freq: Once | ORAL | Status: AC
  Administered 2019-08-11: 1000 mg via ORAL

## 2019-08-11 MED ORDER — SODIUM CHLORIDE 0.9 % IR SOLN
Status: DC | PRN
  Administered 2019-08-11: 3000 mL

## 2019-08-11 MED ORDER — BUPIVACAINE HCL (PF) 0.5 % IJ SOLN
INTRAMUSCULAR | Status: AC
  Filled 2019-08-11: qty 30

## 2019-08-11 MED ORDER — LACTATED RINGERS IV SOLN: INTRAVENOUS | Status: DC

## 2019-08-11 MED ORDER — OXYCODONE HCL 5 MG PO TABS
ORAL_TABLET | ORAL | Status: AC
  Filled 2019-08-11: qty 1

## 2019-08-11 MED ORDER — ONDANSETRON HCL 4 MG PO TABS: 4.0000 mg | ORAL_TABLET | Freq: Three times a day (TID) | ORAL | 0 refills | Status: DC | PRN

## 2019-08-11 MED ORDER — MIDAZOLAM HCL 2 MG/2ML IJ SOLN
1.0000 mg | INTRAMUSCULAR | Status: DC | PRN
  Administered 2019-08-11: 2 mg via INTRAVENOUS

## 2019-08-11 MED ORDER — CEFAZOLIN SODIUM-DEXTROSE 2-4 GM/100ML-% IV SOLN
2.0000 g | INTRAVENOUS | Status: AC
  Administered 2019-08-11: 2 g via INTRAVENOUS

## 2019-08-11 MED ORDER — ACETAMINOPHEN 500 MG PO TABS
ORAL_TABLET | ORAL | Status: AC
  Filled 2019-08-11: qty 2

## 2019-08-11 MED ORDER — LIDOCAINE HCL (CARDIAC) PF 100 MG/5ML IV SOSY
PREFILLED_SYRINGE | INTRAVENOUS | Status: DC | PRN
  Administered 2019-08-11: 60 mg via INTRAVENOUS

## 2019-08-11 MED ORDER — ONDANSETRON HCL 4 MG/2ML IJ SOLN
INTRAMUSCULAR | Status: DC | PRN
  Administered 2019-08-11: 4 mg via INTRAVENOUS

## 2019-08-11 MED ORDER — OXYCODONE HCL 5 MG PO TABS
5.0000 mg | ORAL_TABLET | Freq: Once | ORAL | Status: AC | PRN
  Administered 2019-08-11: 5 mg via ORAL

## 2019-08-11 MED ORDER — METHYLPREDNISOLONE ACETATE 80 MG/ML IJ SUSP
INTRAMUSCULAR | Status: DC | PRN
  Administered 2019-08-11: 80 mg via INTRA_ARTICULAR

## 2019-08-11 MED ORDER — ONDANSETRON HCL 4 MG/2ML IJ SOLN
INTRAMUSCULAR | Status: AC
  Filled 2019-08-11: qty 2

## 2019-08-11 MED ORDER — PROPOFOL 10 MG/ML IV BOLUS
INTRAVENOUS | Status: AC
  Filled 2019-08-11: qty 20

## 2019-08-11 MED ORDER — METHYLPREDNISOLONE ACETATE 80 MG/ML IJ SUSP
INTRAMUSCULAR | Status: AC
  Filled 2019-08-11: qty 1

## 2019-08-11 MED ORDER — CHLORHEXIDINE GLUCONATE 4 % EX LIQD: 60.0000 mL | Freq: Once | CUTANEOUS | Status: DC

## 2019-08-11 MED ORDER — ONDANSETRON HCL 4 MG/2ML IJ SOLN: 4.0000 mg | Freq: Once | INTRAMUSCULAR | Status: DC | PRN

## 2019-08-11 MED ORDER — DEXAMETHASONE SODIUM PHOSPHATE 10 MG/ML IJ SOLN
INTRAMUSCULAR | Status: AC
  Filled 2019-08-11: qty 1

## 2019-08-11 MED ORDER — ASPIRIN EC 81 MG PO TBEC: 81.0000 mg | DELAYED_RELEASE_TABLET | Freq: Two times a day (BID) | ORAL | 0 refills | Status: DC

## 2019-08-11 MED ORDER — DEXAMETHASONE SODIUM PHOSPHATE 10 MG/ML IJ SOLN
INTRAMUSCULAR | Status: DC | PRN
  Administered 2019-08-11: 10 mg via INTRAVENOUS

## 2019-08-11 MED ORDER — HYDROCODONE-ACETAMINOPHEN 5-325 MG PO TABS: 1.0000 | ORAL_TABLET | Freq: Four times a day (QID) | ORAL | 0 refills | Status: AC | PRN

## 2019-08-11 MED ORDER — CEFAZOLIN SODIUM-DEXTROSE 2-4 GM/100ML-% IV SOLN
INTRAVENOUS | Status: AC
  Filled 2019-08-11: qty 100

## 2019-08-11 MED ORDER — FENTANYL CITRATE (PF) 100 MCG/2ML IJ SOLN
25.0000 ug | INTRAMUSCULAR | Status: DC | PRN
  Administered 2019-08-11: 14:00:00 25 ug via INTRAVENOUS
  Administered 2019-08-11: 50 ug via INTRAVENOUS

## 2019-08-11 MED ORDER — FENTANYL CITRATE (PF) 100 MCG/2ML IJ SOLN
INTRAMUSCULAR | Status: AC
  Filled 2019-08-11: qty 2

## 2019-08-11 MED ORDER — OXYCODONE HCL 5 MG/5ML PO SOLN: 5.0000 mg | Freq: Once | ORAL | Status: AC | PRN

## 2019-08-11 MED ORDER — PROPOFOL 10 MG/ML IV BOLUS
INTRAVENOUS | Status: DC | PRN
  Administered 2019-08-11: 200 mg via INTRAVENOUS

## 2019-08-11 MED ORDER — LIDOCAINE 2% (20 MG/ML) 5 ML SYRINGE
INTRAMUSCULAR | Status: AC
  Filled 2019-08-11: qty 5

## 2019-08-11 MED ORDER — MIDAZOLAM HCL 2 MG/2ML IJ SOLN
INTRAMUSCULAR | Status: AC
  Filled 2019-08-11: qty 2

## 2019-08-11 MED ORDER — BUPIVACAINE HCL (PF) 0.5 % IJ SOLN
INTRAMUSCULAR | Status: DC | PRN
  Administered 2019-08-11: 20 mL via INTRA_ARTICULAR

## 2019-08-11 MED ORDER — BUPIVACAINE HCL (PF) 0.25 % IJ SOLN
INTRAMUSCULAR | Status: AC
  Filled 2019-08-11: qty 30

## 2019-08-11 MED FILL — HYDROCODON-APAP 5-325: 5-325 | 5 days supply | Qty: 30 | Fill #0

## 2019-08-11 MED FILL — ONDANSETRON HCL 4 MG TABLET: 4 | 7 days supply | Qty: 20 | Fill #0

## 2019-08-11 SURGICAL SUPPLY — 40 items
BNDG ELASTIC 6X5.8 VLCR STR LF (GAUZE/BANDAGES/DRESSINGS) ×3 IMPLANT
CHLORAPREP W/TINT 26 (MISCELLANEOUS) ×3 IMPLANT
COVER WAND RF STERILE (DRAPES) IMPLANT
CUFF TOURN SGL QUICK 34 (TOURNIQUET CUFF) ×3
CUFF TRNQT CYL 34X4.125X (TOURNIQUET CUFF) ×1 IMPLANT
DISSECTOR  3.8MM X 13CM (MISCELLANEOUS) ×3
DISSECTOR 3.8MM X 13CM (MISCELLANEOUS) ×1 IMPLANT
DISSECTOR 4.0MM X 13CM (MISCELLANEOUS) IMPLANT
DRAPE ARTHROSCOPY W/POUCH 90 (DRAPES) ×3 IMPLANT
DRAPE IMP U-DRAPE 54X76 (DRAPES) ×3 IMPLANT
DRAPE U-SHAPE 47X51 STRL (DRAPES) ×3 IMPLANT
DRSG EMULSION OIL 3X3 NADH (GAUZE/BANDAGES/DRESSINGS) ×3 IMPLANT
EXCALIBUR 3.8MM X 13CM (MISCELLANEOUS) IMPLANT
GAUZE SPONGE 4X4 12PLY STRL (GAUZE/BANDAGES/DRESSINGS) ×3 IMPLANT
GLOVE BIO SURGEON STRL SZ 6.5 (GLOVE) ×2 IMPLANT
GLOVE BIO SURGEON STRL SZ7 (GLOVE) ×3 IMPLANT
GLOVE BIO SURGEON STRL SZ7.5 (GLOVE) ×6 IMPLANT
GLOVE BIO SURGEONS STRL SZ 6.5 (GLOVE) ×1
GLOVE BIOGEL PI IND STRL 6.5 (GLOVE) ×1 IMPLANT
GLOVE BIOGEL PI IND STRL 7.0 (GLOVE) ×2 IMPLANT
GLOVE BIOGEL PI IND STRL 8 (GLOVE) ×2 IMPLANT
GLOVE BIOGEL PI IND STRL 8.5 (GLOVE) IMPLANT
GLOVE BIOGEL PI INDICATOR 6.5 (GLOVE) ×2
GLOVE BIOGEL PI INDICATOR 7.0 (GLOVE) ×4
GLOVE BIOGEL PI INDICATOR 8 (GLOVE) ×4
GLOVE BIOGEL PI INDICATOR 8.5 (GLOVE)
GOWN STRL REUS W/ TWL LRG LVL3 (GOWN DISPOSABLE) ×1 IMPLANT
GOWN STRL REUS W/ TWL XL LVL3 (GOWN DISPOSABLE) ×2 IMPLANT
GOWN STRL REUS W/TWL LRG LVL3 (GOWN DISPOSABLE) ×3
GOWN STRL REUS W/TWL XL LVL3 (GOWN DISPOSABLE) ×6
KNEE WRAP E Z 3 GEL PACK (MISCELLANEOUS) ×3 IMPLANT
MANIFOLD NEPTUNE II (INSTRUMENTS) ×3 IMPLANT
PACK ARTHROSCOPY DSU (CUSTOM PROCEDURE TRAY) ×3 IMPLANT
PACK BASIN DAY SURGERY FS (CUSTOM PROCEDURE TRAY) ×3 IMPLANT
PORT APPOLLO RF 90DEGREE MULTI (SURGICAL WAND) IMPLANT
SUT ETHILON 3 0 PS 1 (SUTURE) ×3 IMPLANT
TAPE CLOTH 3X10 TAN LF (GAUZE/BANDAGES/DRESSINGS) ×3 IMPLANT
TOWEL GREEN STERILE FF (TOWEL DISPOSABLE) ×3 IMPLANT
TUBING ARTHROSCOPY IRRIG 16FT (MISCELLANEOUS) ×3 IMPLANT
WATER STERILE IRR 1000ML POUR (IV SOLUTION) ×3 IMPLANT

## 2019-08-11 NOTE — Anesthesia Preprocedure Evaluation (Signed)
Anesthesia Evaluation  Patient identified by MRN, date of birth, ID band Patient awake    Reviewed: Allergy & Precautions, NPO status , Patient's Chart, lab work & pertinent test results  History of Anesthesia Complications Negative for: history of anesthetic complications  Airway Mallampati: III  TM Distance: >3 FB Neck ROM: Full    Dental  (+) Teeth Intact   Pulmonary neg pulmonary ROS, former smoker,    Pulmonary exam normal        Cardiovascular hypertension, Pt. on medications Normal cardiovascular exam     Neuro/Psych negative neurological ROS  negative psych ROS   GI/Hepatic negative GI ROS, Neg liver ROS,   Endo/Other  negative endocrine ROS  Renal/GU negative Renal ROS  negative genitourinary   Musculoskeletal negative musculoskeletal ROS (+)   Abdominal   Peds  Hematology negative hematology ROS (+)   Anesthesia Other Findings   Reproductive/Obstetrics                             Anesthesia Physical Anesthesia Plan  ASA: II  Anesthesia Plan: General   Post-op Pain Management:    Induction: Intravenous  PONV Risk Score and Plan: 2 and Ondansetron, Dexamethasone, Midazolam and Treatment may vary due to age or medical condition  Airway Management Planned: LMA  Additional Equipment: None  Intra-op Plan:   Post-operative Plan: Extubation in OR  Informed Consent: I have reviewed the patients History and Physical, chart, labs and discussed the procedure including the risks, benefits and alternatives for the proposed anesthesia with the patient or authorized representative who has indicated his/her understanding and acceptance.     Dental advisory given  Plan Discussed with:   Anesthesia Plan Comments:         Anesthesia Quick Evaluation

## 2019-08-11 NOTE — Transfer of Care (Signed)
Immediate Anesthesia Transfer of Care Note  Patient: Brandon [name]  Procedure(s) Performed: KNEE ARTHROSCOPY WITH PARTIAL MEDIAL MENISECTOMY (Left Knee)  Patient Location: PACU  Anesthesia Type:General  Level of Consciousness: awake, alert , oriented and patient cooperative  Airway & Oxygen Therapy: Patient Spontanous Breathing and Patient connected to face mask oxygen  Post-op Assessment: Report given to RN and Post -op Vital signs reviewed and stable  Post vital signs: Reviewed and stable  Last Vitals:  Vitals Value Taken Time  BP    Temp    Pulse 88 08/11/19 1325  Resp 20 08/11/19 1325  SpO2 98 % 08/11/19 1325  Vitals shown include unvalidated device data.  Last Pain:  Vitals:   08/11/19 1052  TempSrc: Temporal  PainSc: 1       Patients Stated Pain Goal: 3 (08/11/19 1052)  Complications: No apparent anesthesia complications

## 2019-08-11 NOTE — Interval H&P Note (Signed)
I participated in the care of this patient and agree with the above history, physical and evaluation. I performed a review of the history and a physical exam as detailed   Lillien Petronio Daniel Bruchy Mikel MD  

## 2019-08-11 NOTE — Discharge Instructions (Signed)
Keep leg elevated with ice to reduce pain and swelling. You may increase pain medication up to 2 tablets every 4 hours for the first few days post op if needed.  Stop as needed pain medication as soon as you are able.  Diet: As you were doing prior to hospitalization   Dressing:  You may remove dressings and shower over incisions 3 days after surgery.  Place clean Band-Aid over incisions.  Continue to use ace wrap for compression to reduce swelling.   Activity:  Increase activity slowly as tolerated, but follow the weight bearing instructions below.  The rules on driving is that you can not be taking narcotics while you drive, and you must feel in control of the vehicle.    Weight Bearing: As tolerated   To prevent constipation: you may use a stool softener such as -  Colace (over the counter) 100 mg by mouth twice a day  Drink plenty of fluids (prune juice may be helpful) and high fiber foods Miralax (over the counter) for constipation as needed.    Itching:  If you experience itching with your medications, try taking only a single pain pill, or even half a pain pill at a time.  You can also use benadryl over the counter for itching or also to help with sleep.   Precautions:  If you experience chest pain or shortness of breath - call 911 immediately for transfer to the hospital emergency department!!  If you develop a fever greater that 101 F, purulent drainage from wound, increased redness or drainage from wound, or calf pain -- Call the office at [phone]                                                 Follow- Up Appointment:  Please call for an appointment to be seen in 2 weeks [place] - [phone]      No Tylenol before 5:00 PM on 08/11/2019 No Hydrocodone until 8:30 PM on 08/11/2019        Post Anesthesia Home Care Instructions  Activity: Get plenty of rest for the remainder of the day. A responsible individual must stay with you for 24 hours following the  procedure.  For the next 24 hours, DO NOT: -Drive a car -Advertising copywriter -Drink alcoholic beverages -Take any medication unless instructed by your physician -Make any legal decisions or sign important papers.  Meals: Start with liquid foods such as gelatin or soup. Progress to regular foods as tolerated. Avoid greasy, spicy, heavy foods. If nausea and/or vomiting occur, drink only clear liquids until the nausea and/or vomiting subsides. Call your physician if vomiting continues.  Special Instructions/Symptoms: Your throat may feel dry or sore from the anesthesia or the breathing tube placed in your throat during surgery. If this causes discomfort, gargle with warm salt water. The discomfort should disappear within 24 hours.  If you had a scopolamine patch placed behind your ear for the management of post- operative nausea and/or vomiting:  1. The medication in the patch is effective for 72 hours, after which it should be removed.  Wrap patch in a tissue and discard in the trash. Wash hands thoroughly with soap and water. 2. You may remove the patch earlier than 72 hours if you experience unpleasant side effects which may include dry mouth, dizziness or visual disturbances. 3. Avoid  touching the patch. Wash your hands with soap and water after contact with the patch.

## 2019-08-11 NOTE — Anesthesia Procedure Notes (Signed)
Procedure Name: LMA Insertion Date/Time: 08/11/2019 12:45 PM Performed by: Shanon Payor, CRNA Pre-anesthesia Checklist: Patient identified, Emergency Drugs available, Suction available, Patient being monitored and Timeout performed Patient Re-evaluated:Patient Re-evaluated prior to induction Oxygen Delivery Method: Circle system utilized Preoxygenation: Pre-oxygenation with 100% oxygen Induction Type: IV induction LMA: LMA inserted LMA Size: 5.0 Number of attempts: 1 Placement Confirmation: positive ETCO2 and breath sounds checked- equal and bilateral Dental Injury: Teeth and Oropharynx as per pre-operative assessment

## 2019-08-11 NOTE — Anesthesia Postprocedure Evaluation (Signed)
Anesthesia Post Note  Patient: Brandon [name]  Procedure(s) Performed: KNEE ARTHROSCOPY WITH PARTIAL MEDIAL MENISECTOMY (Left Knee)     Patient location during evaluation: PACU Anesthesia Type: General Level of consciousness: awake and alert Pain management: pain level controlled Vital Signs Assessment: post-procedure vital signs reviewed and stable Respiratory status: spontaneous breathing, nonlabored ventilation and respiratory function stable Cardiovascular status: blood pressure returned to baseline and stable Postop Assessment: no apparent nausea or vomiting Anesthetic complications: no    Last Vitals:  Vitals:   08/11/19 1325 08/11/19 1330  BP: 137/85 124/88  Pulse:  80  Resp:  (!) 22  Temp:    SpO2:  100%    Last Pain:  Vitals:   08/11/19 1330  TempSrc:   PainSc: 2                  Lucretia Kern

## 2019-08-14 ENCOUNTER — Encounter: Payer: Self-pay | Admitting: *Deleted

## 2019-08-15 NOTE — Op Note (Signed)
08/11/2019  9:01 AM  PATIENT:  Brandon [name]    PRE-OPERATIVE DIAGNOSIS:  LEFT KNEE MEDIAL MENISCUS TEAR  POST-OPERATIVE DIAGNOSIS:  Same  PROCEDURE:  KNEE ARTHROSCOPY WITH PARTIAL MEDIAL MENISECTOMY  SURGEON:  Sheral Apley, MD  ASSISTANT: Aquilla Hacker, PA-C, he was present and scrubbed throughout the case, critical for completion in a timely fashion, and for retraction, instrumentation, and closure.   ANESTHESIA:   General  BLOOD LOSS: min  COMPLICATIONS: None   PREOPERATIVE INDICATIONS:  KERMAN PFOST is a  54 y.o. male with a diagnosis of LEFT KNEE MEDIAL MENISCUS TEAR who failed conservative measures and elected for surgical management.    The risks benefits and alternatives were discussed with the patient preoperatively including but not limited to the risks of infection, bleeding, nerve injury, cardiopulmonary complications, the need for revision surgery, among others, and the patient was willing to proceed.  OPERATIVE IMPLANTS: none  OPERATIVE FINDINGS: Examination under anesthesia: stable Diagnostic Arthroscopy:  articular cartilage:PF chondromalacia Medial meniscus: stable Lateral meniscus:stable Anterior cruciate ligament/PCL: stable Loose bodies: none    OPERATIVE PROCEDURE:  Patient was identified in the preoperative holding area and site was marked by me male was transported to the operating theater and placed on the table in supine position taking care to pad all bony prominences. After a preincinduction time out anesthesia was induced.  male received ancef for preoperative antibiotics. The left lower extremity was prepped and draped in normal sterile fashion and a pre-incision timeout was performed.   A small stab incision was made in the anterolateral portal position. The arthroscope was introduced in the joint. A medial portal was then established under direct visualization just above the anterior horn of the medial meniscus. Diagnostic  arthroscopy was then carried out with findings as described above.  I debrided the medial meniscal tear to a stable remaining meniscus.   I performed a PF chondroplasty  Small lateral meniscal tear was debrided   The arthroscopic equipment was removed from the joint and the portals were closed with 3-0 nylon in an interrupted fashion. The knee was infiltrated with depomedrol 80mg .  Sterile dressings were then applied including Xeroform 4 x 4's ABDs an ACE bandage.  The patient was then allowed to awaken from general anesthesia, transferred to the stretcher and taken to the recovery room in stable condition.  POSTOPERATIVE PLAN: The patient will be discharged home today and will followup in one week for suture removal and wound check.  VTE prophylaxis: mobilize and ASA

## 2019-08-16 MED FILL — ATORVASTATIN 10 MG TABLET: 10 | 30 days supply | Qty: 30 | Fill #3

## 2019-08-16 MED FILL — CARTIA XT 120 MG CP24: 120 | 30 days supply | Qty: 30 | Fill #3

## 2019-08-16 MED FILL — QUETIAPINE FUMARATE 100 MG: 100 | 30 days supply | Qty: 30 | Fill #2

## 2019-08-16 MED FILL — OLMESARTAN-HCTZ 20-12.5 MG: 20-12.5 | 30 days supply | Qty: 30 | Fill #3

## 2019-08-21 DIAGNOSIS — S83242D Other tear of medial meniscus, current injury, left knee, subsequent encounter: Secondary | ICD-10-CM | POA: Diagnosis not present

## 2019-08-23 MED FILL — AMOXICILLIN 500 MG CAPSULE: 500 | 10 days supply | Qty: 30 | Fill #0

## 2019-09-13 MED FILL — ATORVASTATIN 10 MG TABLET: 10 | 90 days supply | Qty: 90 | Fill #0

## 2019-09-13 MED FILL — OLMESARTAN-HCTZ 20-12.5 MG: 20-12.5 | 30 days supply | Qty: 30 | Fill #4

## 2019-09-13 MED FILL — QUETIAPINE FUMARATE 100 MG: 100 | 30 days supply | Qty: 30 | Fill #3

## 2019-09-13 MED FILL — CARTIA XT 120 MG CP24: 120 | 30 days supply | Qty: 30 | Fill #4

## 2019-10-17 MED FILL — QUETIAPINE FUMARATE 100 MG: 100 | 30 days supply | Qty: 30 | Fill #4

## 2019-10-17 MED FILL — VIT D2 1.25 MG (50,000 UNIT: 1.25 MG | 84 days supply | Qty: 24 | Fill #1

## 2019-10-17 MED FILL — OLMESARTAN-HCTZ 20-12.5 MG: 20-12.5 | 30 days supply | Qty: 30 | Fill #0

## 2019-10-18 MED FILL — CARTIA XT 120 MG CP24: 120 | 30 days supply | Qty: 30 | Fill #5

## 2019-11-07 ENCOUNTER — Other Ambulatory Visit (HOSPITAL_COMMUNITY): Payer: Self-pay | Admitting: Internal Medicine

## 2019-11-07 DIAGNOSIS — R7303 Prediabetes: Secondary | ICD-10-CM | POA: Diagnosis not present

## 2019-11-07 DIAGNOSIS — F419 Anxiety disorder, unspecified: Secondary | ICD-10-CM | POA: Diagnosis not present

## 2019-11-07 DIAGNOSIS — I10 Essential (primary) hypertension: Secondary | ICD-10-CM | POA: Diagnosis not present

## 2019-11-07 DIAGNOSIS — E7800 Pure hypercholesterolemia, unspecified: Secondary | ICD-10-CM | POA: Diagnosis not present

## 2019-11-07 DIAGNOSIS — G4733 Obstructive sleep apnea (adult) (pediatric): Secondary | ICD-10-CM | POA: Diagnosis not present

## 2019-11-07 DIAGNOSIS — I429 Cardiomyopathy, unspecified: Secondary | ICD-10-CM | POA: Diagnosis not present

## 2019-11-07 MED FILL — QUETIAPINE FUMARATE 50 MG T: 50 | 30 days supply | Qty: 30 | Fill #0

## 2019-11-10 MED FILL — OLMESARTAN-HCTZ 20-12.5 MG: 20-12.5 | 30 days supply | Qty: 30 | Fill #0

## 2019-11-20 MED FILL — CARTIA XT 120 MG CP24: 120 | 30 days supply | Qty: 30 | Fill #0

## 2019-12-05 MED FILL — QUETIAPINE FUMARATE 50 MG T: 50 | 30 days supply | Qty: 30 | Fill #1

## 2019-12-18 MED FILL — ATORVASTATIN CALCIUM 10 MG: 10 | 90 days supply | Qty: 90 | Fill #1

## 2019-12-18 MED FILL — CARTIA XT 120 MG CP24: 120 | 30 days supply | Qty: 30 | Fill #1

## 2020-01-08 MED FILL — QUETIAPINE FUMARATE 50 MG T: 50 | 30 days supply | Qty: 30 | Fill #2

## 2020-01-08 MED FILL — VIT D2 1.25 MG (50,000 UNIT: 1.25 MG | 84 days supply | Qty: 24 | Fill #2

## 2020-01-12 MED FILL — OLMESARTAN-HCTZ 20-12.5 MG: 20-12.5 | 30 days supply | Qty: 30 | Fill #1

## 2020-01-19 MED FILL — CARTIA XT 120 MG CP24: 120 | 30 days supply | Qty: 30 | Fill #2

## 2020-02-12 MED FILL — CARTIA XT 120 MG CP24: 120 | 30 days supply | Qty: 30 | Fill #3

## 2020-02-12 MED FILL — OLMESARTAN-HCTZ 20-12.5 MG: 20-12.5 | 30 days supply | Qty: 30 | Fill #2

## 2020-02-14 ENCOUNTER — Other Ambulatory Visit (HOSPITAL_COMMUNITY): Payer: Self-pay | Admitting: Physician Assistant

## 2020-02-14 DIAGNOSIS — Z1389 Encounter for screening for other disorder: Secondary | ICD-10-CM | POA: Diagnosis not present

## 2020-02-14 DIAGNOSIS — F419 Anxiety disorder, unspecified: Secondary | ICD-10-CM | POA: Diagnosis not present

## 2020-02-14 DIAGNOSIS — Z125 Encounter for screening for malignant neoplasm of prostate: Secondary | ICD-10-CM | POA: Diagnosis not present

## 2020-02-14 DIAGNOSIS — Z131 Encounter for screening for diabetes mellitus: Secondary | ICD-10-CM | POA: Diagnosis not present

## 2020-02-14 DIAGNOSIS — R7303 Prediabetes: Secondary | ICD-10-CM | POA: Diagnosis not present

## 2020-02-14 DIAGNOSIS — I10 Essential (primary) hypertension: Secondary | ICD-10-CM | POA: Diagnosis not present

## 2020-02-14 DIAGNOSIS — Z0110 Encounter for examination of ears and hearing without abnormal findings: Secondary | ICD-10-CM | POA: Diagnosis not present

## 2020-02-14 DIAGNOSIS — E7800 Pure hypercholesterolemia, unspecified: Secondary | ICD-10-CM | POA: Diagnosis not present

## 2020-02-14 DIAGNOSIS — Z0000 Encounter for general adult medical examination without abnormal findings: Secondary | ICD-10-CM | POA: Diagnosis not present

## 2020-02-14 MED FILL — QUETIAPINE FUMARATE 50 MG T: 50 | 90 days supply | Qty: 90 | Fill #0

## 2020-03-11 MED FILL — OLMESARTAN-HCTZ 20-12.5 MG: 20-12.5 | 90 days supply | Qty: 90 | Fill #3

## 2020-03-11 MED FILL — ATORVASTATIN CALCIUM 10 MG: 10 | 90 days supply | Qty: 90 | Fill #0

## 2020-03-11 MED FILL — CARTIA XT 120 MG CP24: 120 | 90 days supply | Qty: 90 | Fill #0

## 2020-03-20 MED FILL — OLMESARTAN-HCTZ 20-12.5 MG: 20-12.5 | 90 days supply | Qty: 90 | Fill #3

## 2020-03-20 MED FILL — ATORVASTATIN CALCIUM 10 MG: 10 | 90 days supply | Qty: 90 | Fill #0

## 2020-03-20 MED FILL — CARTIA XT 120 MG CP24: 120 | 90 days supply | Qty: 90 | Fill #0

## 2020-05-06 ENCOUNTER — Other Ambulatory Visit (HOSPITAL_COMMUNITY): Payer: Self-pay | Admitting: Physician Assistant

## 2020-05-06 MED FILL — QUETIAPINE FUMARATE 50 MG T: 50 | 90 days supply | Qty: 90 | Fill #1

## 2020-05-06 MED FILL — VIT D2 1.25 MG (50,000 UNIT: 1.25 MG | 28 days supply | Qty: 8 | Fill #0

## 2020-06-14 MED FILL — OLMESARTAN-HCTZ 20-12.5 MG: 20-12.5 | 90 days supply | Qty: 90 | Fill #1

## 2020-06-14 MED FILL — ATORVASTATIN CALCIUM 10 MG: 10 | 90 days supply | Qty: 90 | Fill #1

## 2020-06-14 MED FILL — CARTIA XT 120 MG CP24: 120 | 90 days supply | Qty: 90 | Fill #1

## 2020-06-14 MED FILL — VIT D2 1.25 MG (50,000 UNIT: 1.25 MG | 28 days supply | Qty: 8 | Fill #1

## 2020-07-08 MED FILL — VIT D2 1.25 MG (50,000 UNIT: 1.25 MG | 28 days supply | Qty: 8 | Fill #2

## 2020-07-10 DIAGNOSIS — H524 Presbyopia: Secondary | ICD-10-CM | POA: Diagnosis not present

## 2020-07-10 DIAGNOSIS — H52223 Regular astigmatism, bilateral: Secondary | ICD-10-CM | POA: Diagnosis not present

## 2020-07-15 IMAGING — DX DG KNEE COMPLETE 4+V*L*
4 series · 4 of 4 positions shown · non-contrast
Comparison: None.

CLINICAL DATA: Knee pain, instability, no known injury

EXAM:
LEFT KNEE - COMPLETE 4+ VIEW

[knee ap]
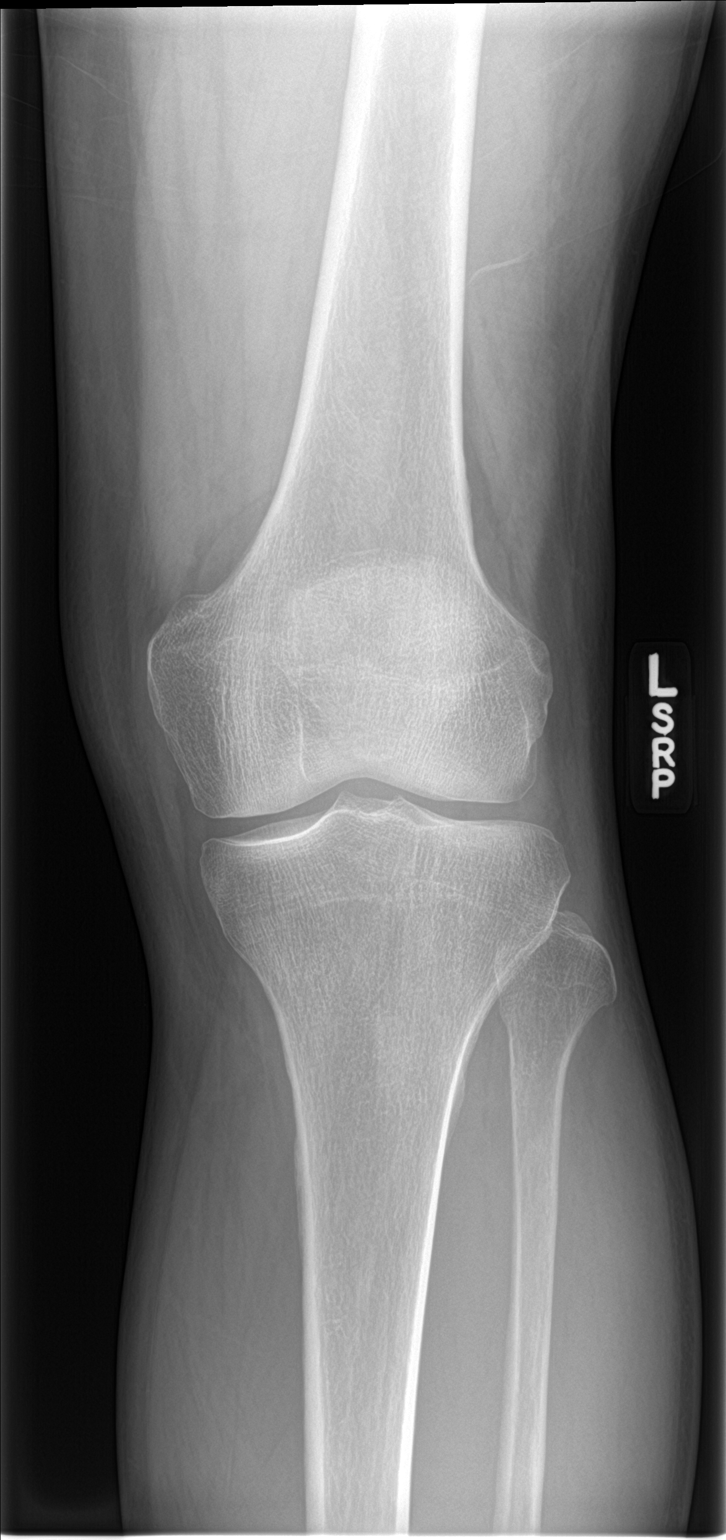

[knee obl (1 of 2)]
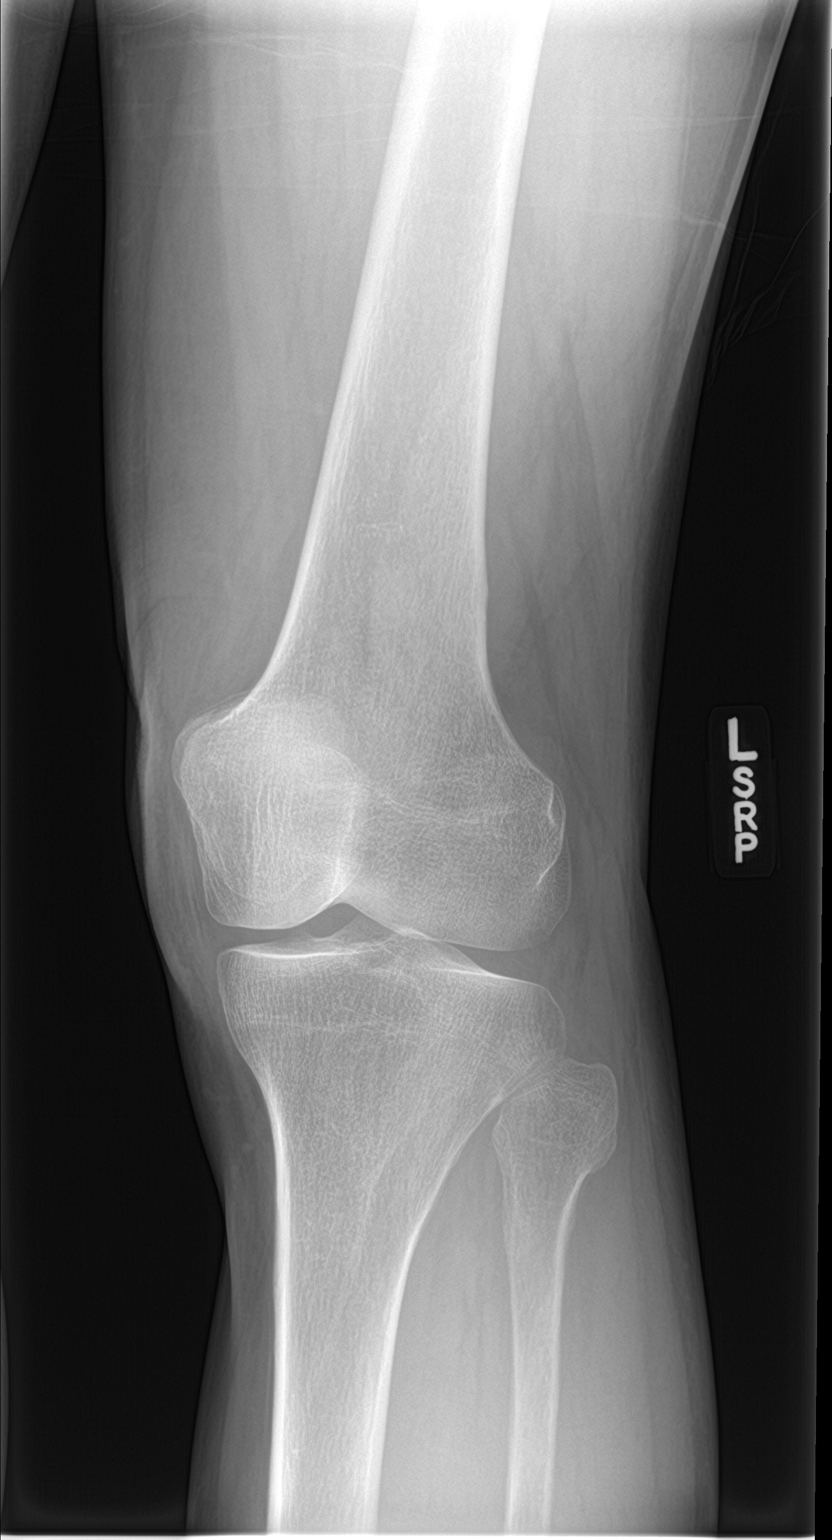

[knee obl (2 of 2)]
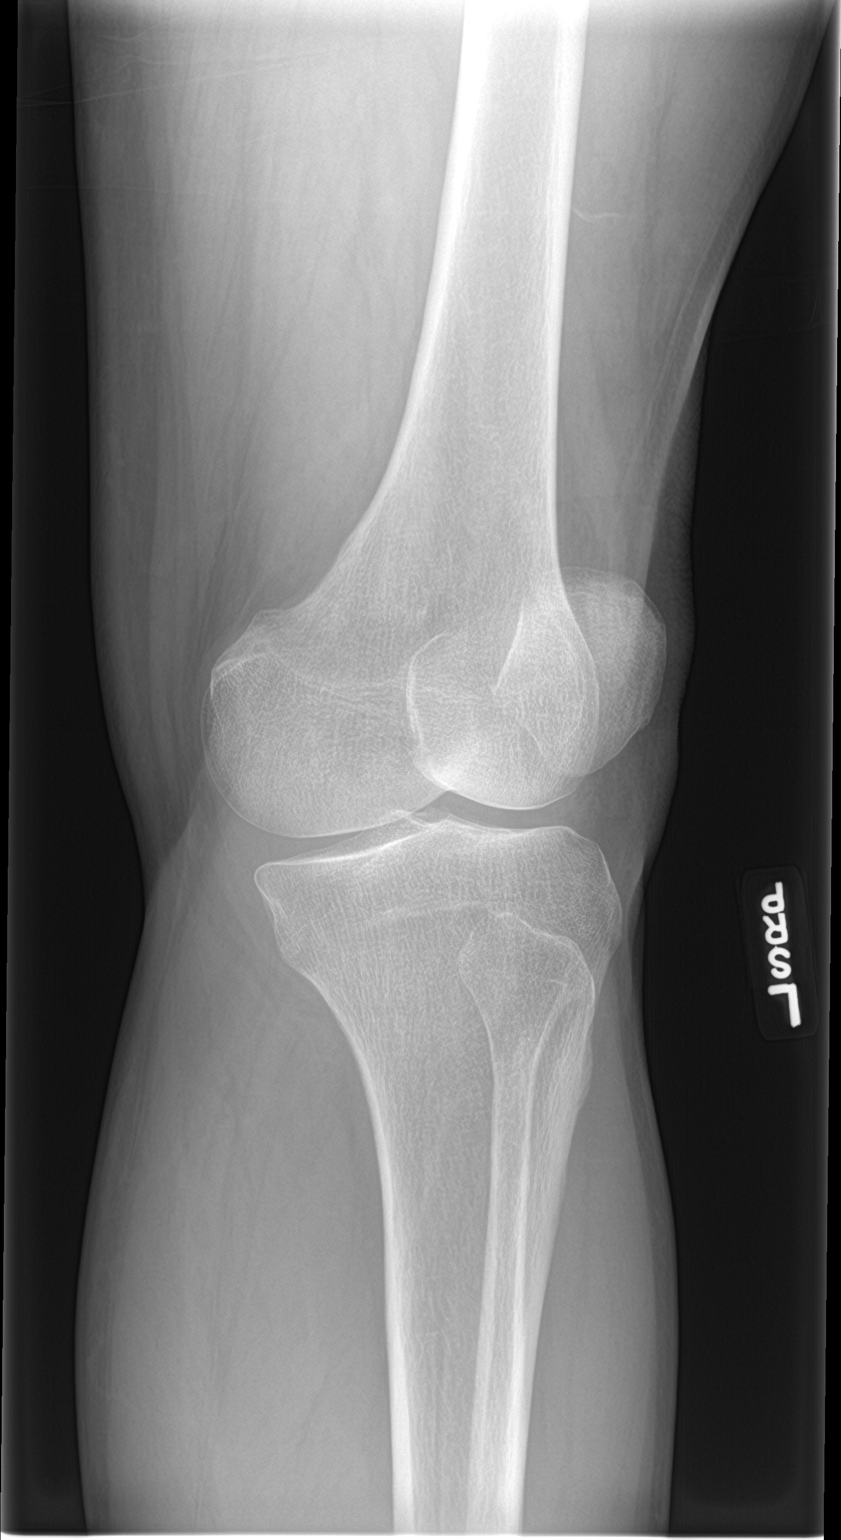

[knee lat]
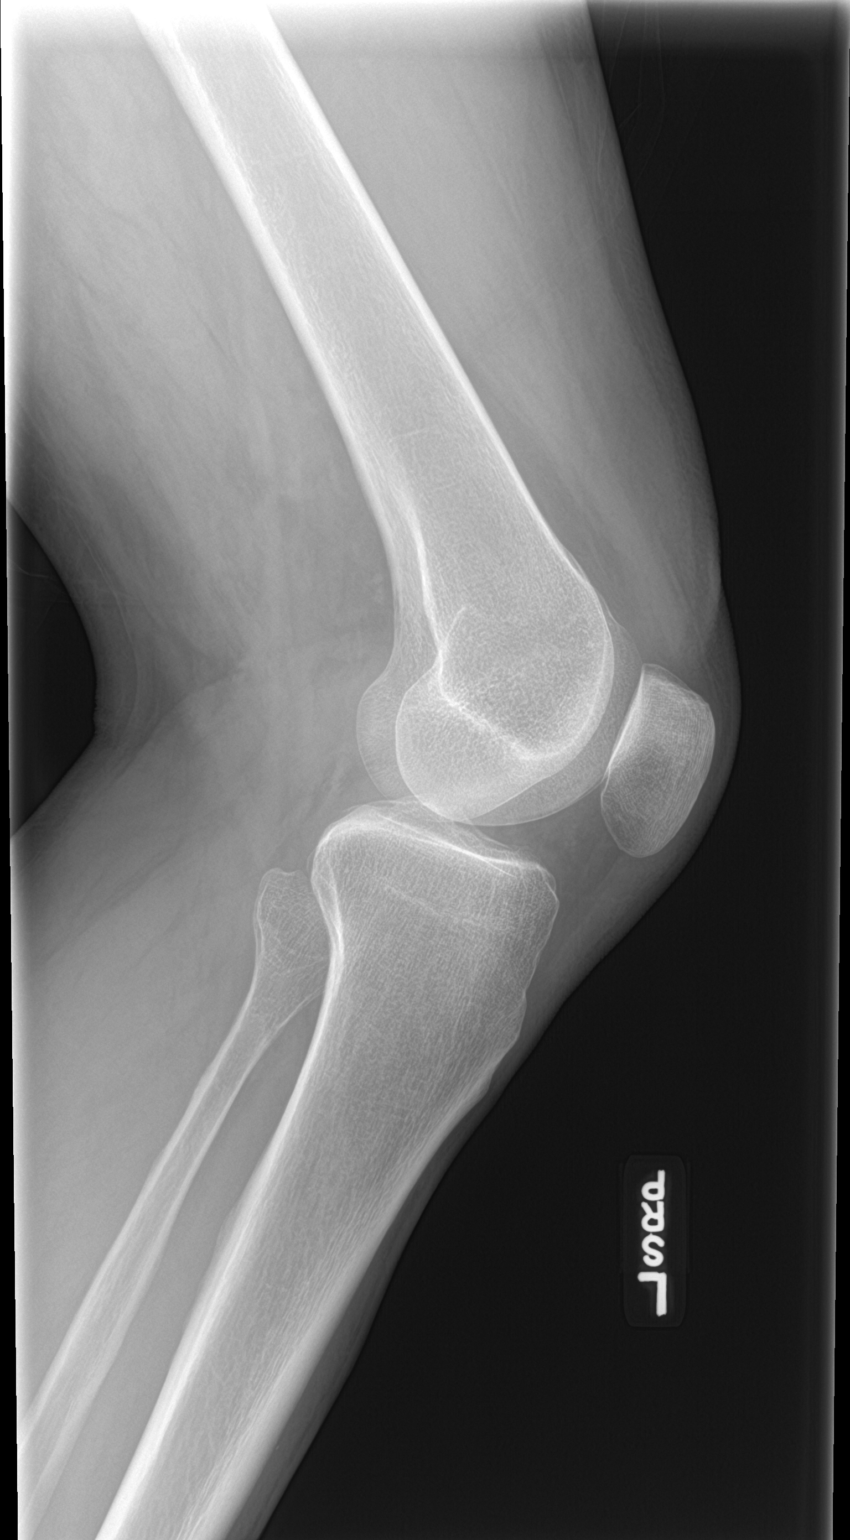

[4 of 4 positions shown; findings below may reference images not displayed]

FINDINGS: No evidence of fracture, dislocation, or joint effusion. No evidence
of arthropathy or other focal bone abnormality. Soft tissues are
unremarkable.
IMPRESSION: No fracture or dislocation of the left knee. Joint spaces are
preserved.

## 2020-08-08 MED FILL — QUETIAPINE FUMARATE 50 MG T: 50 | 90 days supply | Qty: 90 | Fill #2

## 2020-08-08 MED FILL — VIT D2 1.25 MG (50,000 UNIT: 1.25 MG | 84 days supply | Qty: 24 | Fill #3

## 2020-09-12 ENCOUNTER — Other Ambulatory Visit (HOSPITAL_COMMUNITY): Payer: Self-pay

## 2020-09-12 MED FILL — Olmesartan Medoxomil-Hydrochlorothiazide Tab 20-12.5 MG: ORAL | 30 days supply | Qty: 30 | Fill #0 | Status: AC

## 2020-09-19 ENCOUNTER — Other Ambulatory Visit (HOSPITAL_COMMUNITY): Payer: Self-pay

## 2020-09-19 ENCOUNTER — Other Ambulatory Visit (HOSPITAL_COMMUNITY): Payer: Self-pay | Admitting: Internal Medicine

## 2020-09-24 ENCOUNTER — Other Ambulatory Visit (HOSPITAL_COMMUNITY): Payer: Self-pay

## 2020-09-24 ENCOUNTER — Other Ambulatory Visit (HOSPITAL_COMMUNITY): Payer: Self-pay | Admitting: Internal Medicine

## 2020-09-25 ENCOUNTER — Other Ambulatory Visit (HOSPITAL_COMMUNITY): Payer: Self-pay

## 2020-09-28 ENCOUNTER — Other Ambulatory Visit (HOSPITAL_COMMUNITY): Payer: Self-pay

## 2020-09-28 ENCOUNTER — Other Ambulatory Visit (HOSPITAL_COMMUNITY): Payer: Self-pay | Admitting: Internal Medicine

## 2020-09-30 ENCOUNTER — Other Ambulatory Visit (HOSPITAL_COMMUNITY): Payer: Self-pay

## 2020-09-30 MED ORDER — ATORVASTATIN CALCIUM 10 MG PO TABS
10.0000 mg | ORAL_TABLET | Freq: Every day | ORAL | 0 refills | Status: DC
  Filled 2020-09-30: qty 90, 90d supply, fill #0

## 2020-09-30 MED ORDER — DILTIAZEM HCL ER COATED BEADS 120 MG PO CP24
120.0000 mg | ORAL_CAPSULE | Freq: Every day | ORAL | 0 refills | Status: DC
  Filled 2020-09-30: qty 90, 90d supply, fill #0

## 2020-10-16 ENCOUNTER — Other Ambulatory Visit (HOSPITAL_COMMUNITY): Payer: Self-pay

## 2020-10-16 MED FILL — Olmesartan Medoxomil-Hydrochlorothiazide Tab 20-12.5 MG: ORAL | 30 days supply | Qty: 30 | Fill #1 | Status: AC

## 2020-11-04 ENCOUNTER — Other Ambulatory Visit (HOSPITAL_COMMUNITY): Payer: Self-pay

## 2020-11-04 DIAGNOSIS — R7303 Prediabetes: Secondary | ICD-10-CM | POA: Diagnosis not present

## 2020-11-04 DIAGNOSIS — I429 Cardiomyopathy, unspecified: Secondary | ICD-10-CM | POA: Diagnosis not present

## 2020-11-04 DIAGNOSIS — E7800 Pure hypercholesterolemia, unspecified: Secondary | ICD-10-CM | POA: Diagnosis not present

## 2020-11-04 DIAGNOSIS — I10 Essential (primary) hypertension: Secondary | ICD-10-CM | POA: Diagnosis not present

## 2020-11-04 DIAGNOSIS — G4733 Obstructive sleep apnea (adult) (pediatric): Secondary | ICD-10-CM | POA: Diagnosis not present

## 2020-11-04 DIAGNOSIS — F419 Anxiety disorder, unspecified: Secondary | ICD-10-CM | POA: Diagnosis not present

## 2020-11-04 MED ORDER — ATORVASTATIN CALCIUM 10 MG PO TABS
10.0000 mg | ORAL_TABLET | Freq: Every day | ORAL | 5 refills | Status: DC
  Filled 2020-11-04 – 2020-12-20 (×2): qty 30, 30d supply, fill #0
  Filled 2020-12-23: qty 90, 90d supply, fill #0
  Filled 2021-03-26: qty 90, 90d supply, fill #1

## 2020-11-04 MED ORDER — DILTIAZEM HCL ER COATED BEADS 120 MG PO CP24
120.0000 mg | ORAL_CAPSULE | Freq: Every day | ORAL | 1 refills | Status: DC
  Filled 2020-11-04: qty 90, 90d supply, fill #0
  Filled 2020-12-20: qty 30, 30d supply, fill #0
  Filled 2020-12-23 (×3): qty 90, 90d supply, fill #0
  Filled 2021-03-26: qty 90, 90d supply, fill #1

## 2020-11-04 MED ORDER — QUETIAPINE FUMARATE 50 MG PO TABS
25.0000 mg | ORAL_TABLET | Freq: Every day | ORAL | 2 refills | Status: DC
  Filled 2020-11-04: qty 90, 90d supply, fill #0
  Filled 2021-01-28: qty 90, 90d supply, fill #1
  Filled 2021-05-16: qty 90, 90d supply, fill #2

## 2020-11-07 ENCOUNTER — Other Ambulatory Visit (HOSPITAL_COMMUNITY): Payer: Self-pay

## 2020-11-07 MED ORDER — ERGOCALCIFEROL 1.25 MG (50000 UT) PO CAPS
1.0000 | ORAL_CAPSULE | ORAL | 5 refills | Status: DC
  Filled 2020-11-07: qty 8, 28d supply, fill #0
  Filled 2020-12-20: qty 8, 28d supply, fill #1
  Filled 2020-12-23: qty 24, 84d supply, fill #1
  Filled 2020-12-23: qty 8, 28d supply, fill #1
  Filled 2021-03-26: qty 12, 84d supply, fill #2
  Filled 2021-05-16: qty 4, 28d supply, fill #3

## 2020-11-19 ENCOUNTER — Other Ambulatory Visit (HOSPITAL_COMMUNITY): Payer: Self-pay

## 2020-11-20 ENCOUNTER — Other Ambulatory Visit (HOSPITAL_COMMUNITY): Payer: Self-pay

## 2020-11-20 MED ORDER — OLMESARTAN MEDOXOMIL-HCTZ 20-12.5 MG PO TABS
1.0000 | ORAL_TABLET | Freq: Every day | ORAL | 1 refills | Status: DC
  Filled 2020-11-20: qty 90, 90d supply, fill #0
  Filled 2021-02-16: qty 90, 90d supply, fill #1

## 2020-12-04 DIAGNOSIS — G4733 Obstructive sleep apnea (adult) (pediatric): Secondary | ICD-10-CM | POA: Diagnosis not present

## 2020-12-04 DIAGNOSIS — E7800 Pure hypercholesterolemia, unspecified: Secondary | ICD-10-CM | POA: Diagnosis not present

## 2020-12-04 DIAGNOSIS — F419 Anxiety disorder, unspecified: Secondary | ICD-10-CM | POA: Diagnosis not present

## 2020-12-04 DIAGNOSIS — I429 Cardiomyopathy, unspecified: Secondary | ICD-10-CM | POA: Diagnosis not present

## 2020-12-04 DIAGNOSIS — I10 Essential (primary) hypertension: Secondary | ICD-10-CM | POA: Diagnosis not present

## 2020-12-04 DIAGNOSIS — R7303 Prediabetes: Secondary | ICD-10-CM | POA: Diagnosis not present

## 2020-12-20 ENCOUNTER — Other Ambulatory Visit: Payer: Self-pay

## 2020-12-23 ENCOUNTER — Other Ambulatory Visit (HOSPITAL_COMMUNITY): Payer: Self-pay

## 2020-12-23 ENCOUNTER — Other Ambulatory Visit: Payer: Self-pay

## 2020-12-23 MED ORDER — ATORVASTATIN CALCIUM 10 MG PO TABS
10.0000 mg | ORAL_TABLET | Freq: Every evening | ORAL | 5 refills | Status: DC
  Filled 2020-12-23 – 2021-06-22 (×2): qty 30, 30d supply, fill #0
  Filled 2021-07-26: qty 30, 30d supply, fill #1
  Filled 2021-07-28: qty 90, 90d supply, fill #1
  Filled 2021-10-27: qty 60, 60d supply, fill #2

## 2021-01-14 ENCOUNTER — Other Ambulatory Visit: Payer: Self-pay

## 2021-01-28 ENCOUNTER — Other Ambulatory Visit (HOSPITAL_COMMUNITY): Payer: Self-pay

## 2021-02-17 ENCOUNTER — Other Ambulatory Visit (HOSPITAL_COMMUNITY): Payer: Self-pay

## 2021-03-13 DIAGNOSIS — Z1211 Encounter for screening for malignant neoplasm of colon: Secondary | ICD-10-CM | POA: Diagnosis not present

## 2021-03-13 DIAGNOSIS — Z125 Encounter for screening for malignant neoplasm of prostate: Secondary | ICD-10-CM | POA: Diagnosis not present

## 2021-03-13 DIAGNOSIS — Z789 Other specified health status: Secondary | ICD-10-CM | POA: Diagnosis not present

## 2021-03-13 DIAGNOSIS — Z0001 Encounter for general adult medical examination with abnormal findings: Secondary | ICD-10-CM | POA: Diagnosis not present

## 2021-03-26 ENCOUNTER — Other Ambulatory Visit (HOSPITAL_COMMUNITY): Payer: Self-pay

## 2021-03-31 ENCOUNTER — Other Ambulatory Visit (HOSPITAL_COMMUNITY): Payer: Self-pay

## 2021-03-31 MED ORDER — AMOXICILLIN 500 MG PO CAPS
500.0000 mg | ORAL_CAPSULE | Freq: Three times a day (TID) | ORAL | 0 refills | Status: DC
  Filled 2021-03-31: qty 21, 7d supply, fill #0

## 2021-04-17 ENCOUNTER — Other Ambulatory Visit (HOSPITAL_COMMUNITY): Payer: Self-pay

## 2021-04-17 DIAGNOSIS — R7303 Prediabetes: Secondary | ICD-10-CM | POA: Diagnosis not present

## 2021-04-17 DIAGNOSIS — E7800 Pure hypercholesterolemia, unspecified: Secondary | ICD-10-CM | POA: Diagnosis not present

## 2021-04-17 DIAGNOSIS — F419 Anxiety disorder, unspecified: Secondary | ICD-10-CM | POA: Diagnosis not present

## 2021-04-17 DIAGNOSIS — E559 Vitamin D deficiency, unspecified: Secondary | ICD-10-CM | POA: Diagnosis not present

## 2021-04-17 DIAGNOSIS — I10 Essential (primary) hypertension: Secondary | ICD-10-CM | POA: Diagnosis not present

## 2021-04-17 MED ORDER — VITAMIN D3 50 MCG (2000 UT) PO TABS
2000.0000 [IU] | ORAL_TABLET | Freq: Every day | ORAL | 5 refills | Status: DC
  Filled 2021-05-16: qty 30, 30d supply, fill #0
  Filled 2021-05-21 – 2021-06-23 (×5): qty 30, fill #0

## 2021-05-16 ENCOUNTER — Other Ambulatory Visit (HOSPITAL_COMMUNITY): Payer: Self-pay

## 2021-05-19 ENCOUNTER — Other Ambulatory Visit (HOSPITAL_COMMUNITY): Payer: Self-pay

## 2021-05-21 ENCOUNTER — Other Ambulatory Visit (HOSPITAL_COMMUNITY): Payer: Self-pay

## 2021-05-22 ENCOUNTER — Other Ambulatory Visit (HOSPITAL_COMMUNITY): Payer: Self-pay

## 2021-05-22 MED ORDER — OLMESARTAN MEDOXOMIL-HCTZ 20-12.5 MG PO TABS
1.0000 | ORAL_TABLET | Freq: Every day | ORAL | 1 refills | Status: DC
  Filled 2021-05-22: qty 90, 90d supply, fill #0
  Filled 2021-08-15: qty 90, 90d supply, fill #1

## 2021-06-22 ENCOUNTER — Other Ambulatory Visit (HOSPITAL_COMMUNITY): Payer: Self-pay

## 2021-06-23 ENCOUNTER — Other Ambulatory Visit (HOSPITAL_COMMUNITY): Payer: Self-pay

## 2021-06-25 ENCOUNTER — Other Ambulatory Visit (HOSPITAL_COMMUNITY): Payer: Self-pay

## 2021-06-25 MED ORDER — DILTIAZEM HCL ER COATED BEADS 120 MG PO CP24
120.0000 mg | ORAL_CAPSULE | Freq: Every day | ORAL | 1 refills | Status: DC
  Filled 2021-06-25: qty 90, 90d supply, fill #0
  Filled 2021-09-22: qty 90, 90d supply, fill #1

## 2021-06-25 MED ORDER — VITAMIN D3 50 MCG (2000 UT) PO TABS
2000.0000 [IU] | ORAL_TABLET | Freq: Every day | ORAL | 5 refills | Status: AC
  Filled 2021-06-25: qty 30, 30d supply, fill #0

## 2021-07-08 ENCOUNTER — Other Ambulatory Visit (HOSPITAL_COMMUNITY): Payer: Self-pay

## 2021-07-08 MED ORDER — SODIUM FLUORIDE 5000 SENSITIVE 1.1-5 % DT GEL
DENTAL | 2 refills | Status: AC
  Filled 2021-07-08: qty 200, 60d supply, fill #0
  Filled 2021-12-20: qty 200, 60d supply, fill #1

## 2021-07-28 ENCOUNTER — Other Ambulatory Visit (HOSPITAL_COMMUNITY): Payer: Self-pay

## 2021-08-15 ENCOUNTER — Other Ambulatory Visit (HOSPITAL_COMMUNITY): Payer: Self-pay

## 2021-09-01 ENCOUNTER — Other Ambulatory Visit (HOSPITAL_COMMUNITY): Payer: Self-pay

## 2021-09-02 ENCOUNTER — Other Ambulatory Visit (HOSPITAL_COMMUNITY): Payer: Self-pay

## 2021-09-02 MED ORDER — QUETIAPINE FUMARATE 50 MG PO TABS
25.0000 mg | ORAL_TABLET | Freq: Two times a day (BID) | ORAL | 0 refills | Status: DC
Start: 2021-09-02 — End: 2021-11-27
  Filled 2021-09-02: qty 90, 90d supply, fill #0

## 2021-09-03 ENCOUNTER — Other Ambulatory Visit (HOSPITAL_COMMUNITY): Payer: Self-pay

## 2021-09-23 ENCOUNTER — Other Ambulatory Visit (HOSPITAL_COMMUNITY): Payer: Self-pay

## 2021-10-27 ENCOUNTER — Other Ambulatory Visit (HOSPITAL_COMMUNITY): Payer: Self-pay

## 2021-11-08 ENCOUNTER — Other Ambulatory Visit (HOSPITAL_COMMUNITY): Payer: Self-pay

## 2021-11-19 ENCOUNTER — Other Ambulatory Visit (HOSPITAL_COMMUNITY): Payer: Self-pay

## 2021-11-19 MED ORDER — ATORVASTATIN CALCIUM 10 MG PO TABS
10.0000 mg | ORAL_TABLET | Freq: Every day | ORAL | 0 refills | Status: DC
  Filled 2021-11-19 – 2021-12-20 (×2): qty 90, 90d supply, fill #0

## 2021-11-19 MED ORDER — DILTIAZEM HCL ER COATED BEADS 120 MG PO CP24
120.0000 mg | ORAL_CAPSULE | Freq: Every day | ORAL | 0 refills | Status: DC
  Filled 2021-11-19 – 2021-12-20 (×2): qty 90, 90d supply, fill #0

## 2021-11-19 MED ORDER — OLMESARTAN MEDOXOMIL-HCTZ 20-12.5 MG PO TABS
1.0000 | ORAL_TABLET | Freq: Every day | ORAL | 0 refills | Status: DC
  Filled 2021-11-19: qty 90, 90d supply, fill #0

## 2021-11-26 ENCOUNTER — Other Ambulatory Visit (HOSPITAL_COMMUNITY): Payer: Self-pay

## 2021-11-27 ENCOUNTER — Other Ambulatory Visit (HOSPITAL_COMMUNITY): Payer: Self-pay

## 2021-11-27 MED ORDER — QUETIAPINE FUMARATE 50 MG PO TABS
25.0000 mg | ORAL_TABLET | Freq: Two times a day (BID) | ORAL | 0 refills | Status: DC
  Filled 2021-11-27: qty 90, 90d supply, fill #0

## 2021-12-22 ENCOUNTER — Other Ambulatory Visit (HOSPITAL_COMMUNITY): Payer: Self-pay

## 2022-02-11 ENCOUNTER — Other Ambulatory Visit (HOSPITAL_COMMUNITY): Payer: Self-pay

## 2022-02-25 ENCOUNTER — Other Ambulatory Visit (HOSPITAL_COMMUNITY): Payer: Self-pay

## 2022-02-25 MED ORDER — OLMESARTAN MEDOXOMIL-HCTZ 20-12.5 MG PO TABS
1.0000 | ORAL_TABLET | Freq: Every day | ORAL | 1 refills | Status: DC
Start: 2022-02-24 — End: 2022-11-11
  Filled 2022-02-25: qty 90, 90d supply, fill #0
  Filled 2022-05-22: qty 90, 90d supply, fill #1

## 2022-02-25 MED ORDER — ATORVASTATIN CALCIUM 10 MG PO TABS
10.0000 mg | ORAL_TABLET | Freq: Every day | ORAL | 0 refills | Status: DC
  Filled 2022-02-25 – 2022-03-17 (×2): qty 90, 90d supply, fill #0

## 2022-02-25 MED ORDER — QUETIAPINE FUMARATE 50 MG PO TABS
25.0000 mg | ORAL_TABLET | Freq: Every day | ORAL | 2 refills | Status: AC
  Filled 2022-02-25: qty 90, 90d supply, fill #0
  Filled 2022-05-22: qty 90, 90d supply, fill #1
  Filled 2022-09-14: qty 90, 90d supply, fill #2

## 2022-03-03 ENCOUNTER — Other Ambulatory Visit (HOSPITAL_COMMUNITY): Payer: Self-pay

## 2022-03-17 ENCOUNTER — Other Ambulatory Visit (HOSPITAL_COMMUNITY): Payer: Self-pay

## 2022-03-18 ENCOUNTER — Other Ambulatory Visit (HOSPITAL_COMMUNITY): Payer: Self-pay

## 2022-03-20 ENCOUNTER — Other Ambulatory Visit (HOSPITAL_COMMUNITY): Payer: Self-pay

## 2022-03-20 MED ORDER — OLMESARTAN MEDOXOMIL-HCTZ 20-12.5 MG PO TABS
1.0000 | ORAL_TABLET | Freq: Every day | ORAL | 1 refills | Status: AC
  Filled 2022-08-19: qty 90, 90d supply, fill #0
  Filled 2022-11-18: qty 90, 90d supply, fill #1

## 2022-03-20 MED ORDER — QUETIAPINE FUMARATE 50 MG PO TABS
25.0000 mg | ORAL_TABLET | Freq: Two times a day (BID) | ORAL | 3 refills | Status: DC
  Filled 2022-12-19: qty 30, 30d supply, fill #0
  Filled 2023-01-27: qty 30, 30d supply, fill #1
  Filled 2023-02-22 – 2023-03-04 (×2): qty 30, 30d supply, fill #2

## 2022-03-20 MED ORDER — DILTIAZEM HCL ER COATED BEADS 120 MG PO CP24
120.0000 mg | ORAL_CAPSULE | Freq: Every day | ORAL | 0 refills | Status: DC
  Filled 2022-03-20: qty 90, 90d supply, fill #0

## 2022-03-20 MED ORDER — ATORVASTATIN CALCIUM 10 MG PO TABS
10.0000 mg | ORAL_TABLET | Freq: Every day | ORAL | 0 refills | Status: DC
Start: 2022-03-20 — End: 2022-09-10
  Filled 2022-03-20 – 2022-06-15 (×2): qty 90, 90d supply, fill #0

## 2022-04-07 DIAGNOSIS — G4733 Obstructive sleep apnea (adult) (pediatric): Secondary | ICD-10-CM | POA: Diagnosis not present

## 2022-04-07 DIAGNOSIS — E7800 Pure hypercholesterolemia, unspecified: Secondary | ICD-10-CM | POA: Diagnosis not present

## 2022-04-07 DIAGNOSIS — R7303 Prediabetes: Secondary | ICD-10-CM | POA: Diagnosis not present

## 2022-04-07 DIAGNOSIS — I10 Essential (primary) hypertension: Secondary | ICD-10-CM | POA: Diagnosis not present

## 2022-04-07 DIAGNOSIS — I429 Cardiomyopathy, unspecified: Secondary | ICD-10-CM | POA: Diagnosis not present

## 2022-04-07 DIAGNOSIS — E559 Vitamin D deficiency, unspecified: Secondary | ICD-10-CM | POA: Diagnosis not present

## 2022-04-07 DIAGNOSIS — F419 Anxiety disorder, unspecified: Secondary | ICD-10-CM | POA: Diagnosis not present

## 2022-04-07 DIAGNOSIS — Z125 Encounter for screening for malignant neoplasm of prostate: Secondary | ICD-10-CM | POA: Diagnosis not present

## 2022-04-07 DIAGNOSIS — Z0001 Encounter for general adult medical examination with abnormal findings: Secondary | ICD-10-CM | POA: Diagnosis not present

## 2022-04-14 ENCOUNTER — Other Ambulatory Visit (HOSPITAL_COMMUNITY): Payer: Self-pay

## 2022-04-14 DIAGNOSIS — U071 COVID-19: Secondary | ICD-10-CM | POA: Diagnosis not present

## 2022-04-14 MED ORDER — DEXTROMETHORPHAN-GUAIFENESIN 10-100 MG/5ML PO LIQD
10.0000 mL | ORAL | 0 refills | Status: DC
  Filled 2022-04-14: qty 600, 10d supply, fill #0

## 2022-04-14 MED ORDER — NIRMATRELVIR&RITONAVIR 300/100 20 X 150 MG & 10 X 100MG PO TBPK
ORAL_TABLET | ORAL | 0 refills | Status: DC
  Filled 2022-04-14: qty 30, 5d supply, fill #0

## 2022-04-21 DIAGNOSIS — F419 Anxiety disorder, unspecified: Secondary | ICD-10-CM | POA: Diagnosis not present

## 2022-04-21 DIAGNOSIS — R7303 Prediabetes: Secondary | ICD-10-CM | POA: Diagnosis not present

## 2022-04-21 DIAGNOSIS — Z0001 Encounter for general adult medical examination with abnormal findings: Secondary | ICD-10-CM | POA: Diagnosis not present

## 2022-04-21 DIAGNOSIS — Z125 Encounter for screening for malignant neoplasm of prostate: Secondary | ICD-10-CM | POA: Diagnosis not present

## 2022-04-21 DIAGNOSIS — Z1211 Encounter for screening for malignant neoplasm of colon: Secondary | ICD-10-CM | POA: Diagnosis not present

## 2022-04-21 DIAGNOSIS — I429 Cardiomyopathy, unspecified: Secondary | ICD-10-CM | POA: Diagnosis not present

## 2022-04-21 DIAGNOSIS — I10 Essential (primary) hypertension: Secondary | ICD-10-CM | POA: Diagnosis not present

## 2022-04-21 DIAGNOSIS — E7800 Pure hypercholesterolemia, unspecified: Secondary | ICD-10-CM | POA: Diagnosis not present

## 2022-04-21 DIAGNOSIS — G4733 Obstructive sleep apnea (adult) (pediatric): Secondary | ICD-10-CM | POA: Diagnosis not present

## 2022-04-21 DIAGNOSIS — E559 Vitamin D deficiency, unspecified: Secondary | ICD-10-CM | POA: Diagnosis not present

## 2022-04-21 DIAGNOSIS — Z1212 Encounter for screening for malignant neoplasm of rectum: Secondary | ICD-10-CM | POA: Diagnosis not present

## 2022-04-29 DIAGNOSIS — E7800 Pure hypercholesterolemia, unspecified: Secondary | ICD-10-CM | POA: Diagnosis not present

## 2022-04-29 DIAGNOSIS — E559 Vitamin D deficiency, unspecified: Secondary | ICD-10-CM | POA: Diagnosis not present

## 2022-04-29 DIAGNOSIS — F419 Anxiety disorder, unspecified: Secondary | ICD-10-CM | POA: Diagnosis not present

## 2022-04-29 DIAGNOSIS — R195 Other fecal abnormalities: Secondary | ICD-10-CM | POA: Diagnosis not present

## 2022-04-29 DIAGNOSIS — I10 Essential (primary) hypertension: Secondary | ICD-10-CM | POA: Diagnosis not present

## 2022-04-29 DIAGNOSIS — G4733 Obstructive sleep apnea (adult) (pediatric): Secondary | ICD-10-CM | POA: Diagnosis not present

## 2022-04-29 DIAGNOSIS — I429 Cardiomyopathy, unspecified: Secondary | ICD-10-CM | POA: Diagnosis not present

## 2022-04-29 DIAGNOSIS — R7303 Prediabetes: Secondary | ICD-10-CM | POA: Diagnosis not present

## 2022-05-25 ENCOUNTER — Other Ambulatory Visit (HOSPITAL_COMMUNITY): Payer: Self-pay

## 2022-06-15 ENCOUNTER — Other Ambulatory Visit: Payer: Self-pay

## 2022-06-15 ENCOUNTER — Other Ambulatory Visit (HOSPITAL_COMMUNITY): Payer: Self-pay

## 2022-06-16 ENCOUNTER — Other Ambulatory Visit (HOSPITAL_COMMUNITY): Payer: Self-pay

## 2022-06-16 MED ORDER — DILTIAZEM HCL ER COATED BEADS 120 MG PO CP24
120.0000 mg | ORAL_CAPSULE | Freq: Every day | ORAL | 1 refills | Status: AC
  Filled 2022-06-16: qty 90, 90d supply, fill #0
  Filled 2022-09-08: qty 90, 90d supply, fill #1

## 2022-06-16 MED ORDER — DILTIAZEM HCL ER COATED BEADS 120 MG PO CP24
120.0000 mg | ORAL_CAPSULE | Freq: Every day | ORAL | 1 refills | Status: DC
  Filled 2022-12-19: qty 90, 90d supply, fill #0
  Filled 2023-03-20: qty 90, 90d supply, fill #1

## 2022-06-30 DIAGNOSIS — R195 Other fecal abnormalities: Secondary | ICD-10-CM | POA: Diagnosis not present

## 2022-06-30 DIAGNOSIS — E669 Obesity, unspecified: Secondary | ICD-10-CM | POA: Diagnosis not present

## 2022-06-30 DIAGNOSIS — I10 Essential (primary) hypertension: Secondary | ICD-10-CM | POA: Diagnosis not present

## 2022-06-30 DIAGNOSIS — E782 Mixed hyperlipidemia: Secondary | ICD-10-CM | POA: Diagnosis not present

## 2022-06-30 DIAGNOSIS — Z1211 Encounter for screening for malignant neoplasm of colon: Secondary | ICD-10-CM | POA: Diagnosis not present

## 2022-07-02 DIAGNOSIS — R195 Other fecal abnormalities: Secondary | ICD-10-CM | POA: Diagnosis not present

## 2022-07-02 DIAGNOSIS — I10 Essential (primary) hypertension: Secondary | ICD-10-CM | POA: Diagnosis not present

## 2022-07-02 DIAGNOSIS — E782 Mixed hyperlipidemia: Secondary | ICD-10-CM | POA: Diagnosis not present

## 2022-07-02 DIAGNOSIS — E669 Obesity, unspecified: Secondary | ICD-10-CM | POA: Diagnosis not present

## 2022-07-02 DIAGNOSIS — Z1211 Encounter for screening for malignant neoplasm of colon: Secondary | ICD-10-CM | POA: Diagnosis not present

## 2022-07-09 ENCOUNTER — Other Ambulatory Visit (HOSPITAL_COMMUNITY): Payer: Self-pay

## 2022-07-09 MED ORDER — GOLYTELY 236 G PO SOLR
236.0000 g | ORAL | 0 refills | Status: DC
  Filled 2022-07-09: qty 4000, 1d supply, fill #0

## 2022-07-22 DIAGNOSIS — Z1211 Encounter for screening for malignant neoplasm of colon: Secondary | ICD-10-CM | POA: Diagnosis not present

## 2022-07-22 DIAGNOSIS — K635 Polyp of colon: Secondary | ICD-10-CM | POA: Diagnosis not present

## 2022-07-22 DIAGNOSIS — D125 Benign neoplasm of sigmoid colon: Secondary | ICD-10-CM | POA: Diagnosis not present

## 2022-07-22 DIAGNOSIS — K5730 Diverticulosis of large intestine without perforation or abscess without bleeding: Secondary | ICD-10-CM | POA: Diagnosis not present

## 2022-07-22 DIAGNOSIS — R195 Other fecal abnormalities: Secondary | ICD-10-CM | POA: Diagnosis not present

## 2022-08-19 ENCOUNTER — Other Ambulatory Visit: Payer: Self-pay

## 2022-08-19 ENCOUNTER — Other Ambulatory Visit (HOSPITAL_COMMUNITY): Payer: Self-pay

## 2022-09-08 ENCOUNTER — Other Ambulatory Visit (HOSPITAL_COMMUNITY): Payer: Self-pay

## 2022-09-08 ENCOUNTER — Other Ambulatory Visit: Payer: Self-pay

## 2022-09-10 ENCOUNTER — Other Ambulatory Visit (HOSPITAL_COMMUNITY): Payer: Self-pay

## 2022-09-10 MED ORDER — ATORVASTATIN CALCIUM 10 MG PO TABS
10.0000 mg | ORAL_TABLET | Freq: Every day | ORAL | 5 refills | Status: AC
  Filled 2022-09-10: qty 30, 30d supply, fill #0
  Filled 2022-10-15: qty 90, 90d supply, fill #1
  Filled 2023-01-27: qty 90, 90d supply, fill #2

## 2022-10-15 ENCOUNTER — Other Ambulatory Visit: Payer: Self-pay

## 2022-11-11 ENCOUNTER — Other Ambulatory Visit (HOSPITAL_COMMUNITY): Payer: Self-pay

## 2022-11-11 ENCOUNTER — Telehealth: Payer: Commercial Managed Care - PPO | Admitting: Physician Assistant

## 2022-11-11 DIAGNOSIS — L719 Rosacea, unspecified: Secondary | ICD-10-CM

## 2022-11-11 MED ORDER — METRONIDAZOLE 0.75 % EX GEL
1.0000 | Freq: Two times a day (BID) | CUTANEOUS | 0 refills | Status: AC
Start: 2022-11-11 — End: ?
  Filled 2022-11-11: qty 45, 23d supply, fill #0

## 2022-11-11 NOTE — Progress Notes (Signed)
Thank you for the details you included in the comment boxes. Those details are very helpful in determining the best course of treatment for you and help [place] to provide the best care. Because you are unable to attach a photo, we recommend that you convert this visit to a video visit in order for the provider to better assess what is going on.  The provider will be able to give you a more accurate diagnosis and treatment plan if we can more freely discuss your symptoms and with the addition of a virtual examination.   If you convert to a video visit, we will bill your insurance (similar to an office visit) and you will not be charged for this e-Visit. You will be able to stay at home and speak with the first available [facility] advanced practice provider. The link to do a video visit is in the drop down Menu tab of your Welcome screen in MyChart.  You will need to schedule the video visit. You can do this through one of two ways:  1) Go into your MyChart App and select the "Menu" button, then select the "Virtual Urgent Care Visit" then proceed scheduling -OR- 2) Go to [email or web address] and select "Get Started" under the Virtual Urgent Care option, select "View all options", then select the "Schedule on your Time" and proceed with scheduling.   I have spent 5 minutes in review of e-visit questionnaire, review and updating patient chart, medical decision making and response to patient.   Margaretann Loveless, PA-C

## 2022-11-11 NOTE — Addendum Note (Signed)
Addended by: Margaretann Loveless on: 11/11/2022 03:52 PM   Modules accepted: Orders

## 2022-11-11 NOTE — Progress Notes (Signed)
E visit for Rosacea We are sorry that you are not feeling well. Here is how we plan to help! Based on what you shared with me it looks like you have Rosacea.  Rosacea is a common chronic skin condition that usually only affects the face and eyes.  Occasionally, the neck, chest, or other areas may be involved.  Characterized by redness, pimples, and broken blood vessels, rosacea tends to begin after middle age (between the ages of 17 and 32).  It is more common in fair-skinned people and women in menopause. It may appear differently in dark skinned people but rosacea effects all ethnic groups.  The cause of rosacea is not fully understood. We do know that rosacea is worsened by various trigger factors including, spicy or hot foods, hot beverages such as coffee or tea, alcohol, and sun exposure just to name a few.  Signs of rosacea may vary greatly from person to person. In some individuals it may only flare up from time to time.   I have prescribed: A topical antibiotic called Metronidazole 1%.  Apply a thin film to affected area once daily.  Wash your hands before and after use. Make sure your skin is clean and dry.  Rub in gently and completely over the affected areas.   HOME CARE: Keep a record of triggers, such as stress, weather, or certain foods or drinks. Consider limiting hot or spicy foods and alcohol. Always use sunscreen that protects against UVA and UVB rays and has a sun-protecting factor (SPF) of 15 or higher. Avoid putting steroids on the skin sores. Steroids may make rosacea worse.  You may use small amounts of water based cosmetic while using this medication.  Apply cosmetics after cream has dried. If you shave your face, use an electric razor Don't scrub your skin or use sponges, brushes, or other abrasive tools. Doing so can irritate your skin. GET HELP RIGHT AWAY IF: If your rosacea gets worse or is not better within 4 weeks. If a new skin condition or rash develops. Loss of  feeling or tingling of treated area Nausea  MAKE SURE YOU   Understand these instructions. Will watch your condition. Will get help right away if you are not doing well or get worse.   Thank you for choosing an e-visit.  Your e-visit answers were reviewed by a board certified advanced clinical practitioner to complete your personal care plan. Depending upon the condition, your plan could have included both over the counter or prescription medications.  Please review your pharmacy choice. Make sure the pharmacy is open so you can pick up prescription now. If there is a problem, you may contact your provider through [employer] and have the prescription routed to another pharmacy.  Your safety is important to [place]. If you have drug allergies check your prescription carefully.   For the next 24 hours you can use MyChart to ask questions about today's visit, request a non-urgent call back, or ask for a work or school excuse. You will get an email in the next two days asking about your experience. I hope that your e-visit has been valuable and will speed your recovery.  I have spent 5 minutes in review of e-visit questionnaire, review and updating patient chart, medical decision making and response to patient.   Margaretann Loveless, PA-C

## 2022-12-20 ENCOUNTER — Other Ambulatory Visit (HOSPITAL_COMMUNITY): Payer: Self-pay

## 2022-12-21 ENCOUNTER — Other Ambulatory Visit: Payer: Self-pay

## 2023-01-28 ENCOUNTER — Other Ambulatory Visit (HOSPITAL_COMMUNITY): Payer: Self-pay

## 2023-02-16 ENCOUNTER — Other Ambulatory Visit (HOSPITAL_COMMUNITY): Payer: Self-pay

## 2023-02-16 DIAGNOSIS — L218 Other seborrheic dermatitis: Secondary | ICD-10-CM | POA: Diagnosis not present

## 2023-02-16 DIAGNOSIS — Z85828 Personal history of other malignant neoplasm of skin: Secondary | ICD-10-CM | POA: Diagnosis not present

## 2023-02-16 DIAGNOSIS — D485 Neoplasm of uncertain behavior of skin: Secondary | ICD-10-CM | POA: Diagnosis not present

## 2023-02-16 DIAGNOSIS — C44622 Squamous cell carcinoma of skin of right upper limb, including shoulder: Secondary | ICD-10-CM | POA: Diagnosis not present

## 2023-02-16 MED ORDER — DESONIDE 0.05 % EX LOTN
1.0000 | TOPICAL_LOTION | Freq: Two times a day (BID) | CUTANEOUS | 3 refills | Status: AC
  Filled 2023-02-16: qty 118, 30d supply, fill #0

## 2023-02-22 ENCOUNTER — Other Ambulatory Visit (HOSPITAL_COMMUNITY): Payer: Self-pay

## 2023-02-22 ENCOUNTER — Other Ambulatory Visit: Payer: Self-pay

## 2023-03-01 ENCOUNTER — Other Ambulatory Visit (HOSPITAL_COMMUNITY): Payer: Self-pay

## 2023-03-01 MED ORDER — OLMESARTAN MEDOXOMIL-HCTZ 20-12.5 MG PO TABS
1.0000 | ORAL_TABLET | Freq: Every day | ORAL | 1 refills | Status: AC
  Filled 2023-03-01: qty 90, 90d supply, fill #0
  Filled 2023-05-28: qty 90, 90d supply, fill #1

## 2023-03-03 ENCOUNTER — Other Ambulatory Visit (HOSPITAL_COMMUNITY): Payer: Self-pay

## 2023-03-04 ENCOUNTER — Other Ambulatory Visit (HOSPITAL_COMMUNITY): Payer: Self-pay

## 2023-03-20 ENCOUNTER — Other Ambulatory Visit (HOSPITAL_COMMUNITY): Payer: Self-pay

## 2023-03-22 ENCOUNTER — Other Ambulatory Visit: Payer: Self-pay

## 2023-03-24 ENCOUNTER — Other Ambulatory Visit: Payer: Self-pay

## 2023-03-24 ENCOUNTER — Other Ambulatory Visit (HOSPITAL_COMMUNITY): Payer: Self-pay

## 2023-03-24 MED ORDER — DILTIAZEM HCL ER COATED BEADS 120 MG PO CP24
120.0000 mg | ORAL_CAPSULE | Freq: Every day | ORAL | 1 refills | Status: DC
  Filled 2023-06-16: qty 90, 90d supply, fill #0
  Filled 2023-09-14: qty 90, 90d supply, fill #1

## 2023-03-24 MED ORDER — OLMESARTAN MEDOXOMIL-HCTZ 20-12.5 MG PO TABS
1.0000 | ORAL_TABLET | Freq: Every day | ORAL | 1 refills | Status: DC
  Filled 2023-08-24: qty 90, 90d supply, fill #0
  Filled 2023-12-05: qty 90, 90d supply, fill #1

## 2023-03-24 MED ORDER — ATORVASTATIN CALCIUM 10 MG PO TABS
10.0000 mg | ORAL_TABLET | Freq: Every evening | ORAL | 5 refills | Status: DC
  Filled 2023-03-24: qty 30, 30d supply, fill #0
  Filled 2023-04-26: qty 90, 90d supply, fill #1
  Filled 2023-07-30 (×2): qty 60, 60d supply, fill #2

## 2023-04-02 ENCOUNTER — Other Ambulatory Visit (HOSPITAL_COMMUNITY): Payer: Self-pay

## 2023-04-06 ENCOUNTER — Other Ambulatory Visit (HOSPITAL_COMMUNITY): Payer: Self-pay

## 2023-04-08 ENCOUNTER — Other Ambulatory Visit (HOSPITAL_COMMUNITY): Payer: Self-pay

## 2023-04-08 MED ORDER — QUETIAPINE FUMARATE 50 MG PO TABS
25.0000 mg | ORAL_TABLET | Freq: Two times a day (BID) | ORAL | 1 refills | Status: AC
  Filled 2023-04-08: qty 90, 90d supply, fill #0
  Filled 2023-07-30: qty 90, 90d supply, fill #1

## 2023-04-26 ENCOUNTER — Other Ambulatory Visit: Payer: Self-pay

## 2023-06-16 ENCOUNTER — Other Ambulatory Visit (HOSPITAL_COMMUNITY): Payer: Self-pay

## 2023-07-30 ENCOUNTER — Other Ambulatory Visit (HOSPITAL_COMMUNITY): Payer: Self-pay

## 2023-07-30 ENCOUNTER — Other Ambulatory Visit: Payer: Self-pay

## 2023-08-05 ENCOUNTER — Other Ambulatory Visit (HOSPITAL_COMMUNITY): Payer: Self-pay

## 2023-08-22 ENCOUNTER — Telehealth: Admitting: Physician Assistant

## 2023-08-22 ENCOUNTER — Encounter: Payer: Self-pay | Admitting: Physician Assistant

## 2023-08-22 ENCOUNTER — Encounter

## 2023-08-22 DIAGNOSIS — U071 COVID-19: Secondary | ICD-10-CM

## 2023-08-22 MED ORDER — BENZONATATE 100 MG PO CAPS: ORAL_CAPSULE | ORAL | 0 refills | Status: AC

## 2023-08-22 NOTE — Patient Instructions (Signed)
 Brandon [name], thank you for joining Roney Jaffe, PA-C for today's virtual visit.  While this provider is not your primary care provider (PCP), if your PCP is located in our provider database this encounter information will be shared with them immediately following your visit.   A [facility] MyChart account gives you access to today's visit and all your visits, tests, and labs performed at [facility] " click here if you don't have a [facility] MyChart account or go to [email or web address]  Consent: (Patient) Brandon [name] provided verbal consent for this virtual visit at the beginning of the encounter.  Current Medications:  Current Outpatient Medications:    benzonatate (TESSALON) 100 MG capsule, Take 1-2 caps PO TID PRN, Disp: 20 capsule, Rfl: 0   atorvastatin (LIPITOR) 10 MG tablet, Take 1 tablet (10 mg total) by mouth at bedtime., Disp: 30 tablet, Rfl: 5   atorvastatin (LIPITOR) 10 MG tablet, Take 1 tablet (10 mg total) by mouth at bedtime., Disp: 30 tablet, Rfl: 5   Cholecalciferol (VITAMIN D3) 50 MCG (2000 UT) TABS, Take 2,000 Units by mouth daily., Disp: 30 tablet, Rfl: 5   desonide (DESOWEN) 0.05 % lotion, Apply a small amount to affected area twice a day, for seb. derm., Disp: 118 mL, Rfl: 3   diltiazem (CARTIA XT) 120 MG 24 hr capsule, Take 1 capsule (120 mg total) by mouth daily., Disp: 90 capsule, Rfl: 1   diltiazem (CARTIA XT) 120 MG 24 hr capsule, Take 1 capsule (120 mg total) by mouth daily., Disp: 90 capsule, Rfl: 1   metroNIDAZOLE (METROGEL) 0.75 % gel, Apply 1 Application topically 2 (two) times daily., Disp: 45 g, Rfl: 0   olmesartan-hydrochlorothiazide (BENICAR HCT) 20-12.5 MG tablet, Take 1 tablet by mouth daily., Disp: 90 tablet, Rfl: 1   olmesartan-hydrochlorothiazide (BENICAR HCT) 20-12.5 MG tablet, Take 1 tablet by mouth daily., Disp: 90 tablet, Rfl: 1   olmesartan-hydrochlorothiazide (BENICAR HCT) 20-12.5 MG tablet, Take 1 tablet by mouth  daily., Disp: 90 tablet, Rfl: 1   QUEtiapine (SEROQUEL) 50 MG tablet, Take 0.5 tablets (25 mg total) by mouth daily to twice daily, Disp: 90 tablet, Rfl: 2   QUEtiapine (SEROQUEL) 50 MG tablet, Take 1/2 tablet (25 mg total) by mouth 1-2 times daily., Disp: 90 tablet, Rfl: 1   Sod Fluoride-Potassium Nitrate (SODIUM FLUORIDE 5000 SENSITIVE) 1.1-5 % GEL, Brush for 2 minutes at bedtime, spit and do not rinse for 30 minutes, Disp: 200 mL, Rfl: 2   Medications ordered in this encounter:  Meds ordered this encounter  Medications   benzonatate (TESSALON) 100 MG capsule    Sig: Take 1-2 caps PO TID PRN    Dispense:  20 capsule    Refill:  0    Supervising Provider:   Merrilee Jansky [[id number]]     *If you need refills on other medications prior to your next appointment, please contact your pharmacy*  Follow-Up: Call back or seek an in-person evaluation if the symptoms worsen or if the condition fails to improve as anticipated.  [facility] [phone]  Other Instructions I did send some tessalon perles to your pharmacy to help you with the cough.   Infection Prevention in the Home If you have an infection, may have been exposed to an infection, or are taking care of someone who has an infection, it is important to know how to keep the infection from spreading. Follow your health care provider's instructions and use these guidelines to  help stop the spread of infection. How infections are spread In order for an infection to spread, the following must be present: A germ. This may be a virus, bacteria, fungus, or parasite. A place for the germ to live. This may be: On or in a person, animal, plant, or food. In soil or water. On surfaces, such as a door handle. A person or animal who can develop a disease if the germ enters the body (host). The host does not have resistance to the germ. A way for the germ to enter the host. This may occur by: Direct contact with an infected  person or animal. This can happen through shaking hands or hugging. Some germs can also travel through the air and spread to others. This can happen when an infected person coughs or sneezes on or near other people. Indirect contact. This occurs when the germ enters the host through contact with an infected object. Examples include: Eating or drinking food or water that is contaminated with the germ. Touching a contaminated surface with your hands, and then touching your face, eyes, nose, or mouth. Supplies needed: Soap. Alcohol-based hand sanitizer. Standard cleaning products. Disinfectants, such as bleach. Reusable cleaning cloths, sponges, or paper towels. Disposable or reusable utility gloves. How to prevent infection from spreading There are several things that you can do to help prevent infection from spreading. Take these general actions Everyone should take the following actions to prevent the spread of infection: Wash your hands often with soap and water for at least 20 seconds. If soap and water are not available, use alcohol-based hand sanitizer. Avoid touching your face, mouth, nose, or eyes. Cough or sneeze into a tissue, sleeve, or elbow instead of into your hand or into the air. If you cough or sneeze into a tissue, throw it away immediately and wash your hands.  Keep your bathroom clean Provide soap. Change towels and washcloths frequently. Change toothbrushes often and store them separately in a clean, dry place. Clean and disinfect all surfaces, including the toilet, floor, tub, shower, and sink. Do not share personal items, such as razors, toothbrushes, deodorant, combs, brushes, towels, and washcloths. Maintain hygiene in the [facility] your hands before and after preparing food and before you eat. Clean the inside of your refrigerator each week. Keep your refrigerator set at 84F (4C) or less, and set your freezer at 31F (-18C) or less. Keep work surfaces  clean. Disinfect them regularly. Wash your dishes in hot, soapy water. Air-dry your dishes or use a dishwasher. Do not share dishes or eating utensils. Handle food safely Store food carefully. Refrigerate leftovers promptly in covered containers. Throw out stale or spoiled food. Thaw foods in the refrigerator or microwave, not at room temperature. Serve foods at the proper temperature. Do not eat raw meat. Make sure it is cooked to the appropriate temperature. Cook eggs until they are firm. Wash fruits and vegetables under running water. Use separate cutting boards, plates, and utensils for raw foods and cooked foods. Use a clean spoon each time you sample food while cooking. Do laundry the right way Wear gloves if laundry is visibly soiled. Do not shake soiled laundry. Doing that may send germs into the air. Wash laundry in hot water. If you cannot wash the laundry right away, place it in a plastic bag and wash it as soon as possible. Be careful around animals and pets Wash your hands before and after touching animals. If you have a pet, ensure  that your pet stays clean. Do not let people with weak immune systems touch bird droppings, fish tank water, or a litter box. If you have a pet cage or litter box, be sure to clean it every day. If you are sick, stay away from animals and have someone else care for them if possible. How to clean and disinfect objects and surfaces Precautions Some disinfectants work for certain germs and not others. Read the manufacturer's instructions or read online resources to determine if the product you are using will work for the germ you are trying to remove. If you choose to use bleach, use it safely. Never mix it with other cleaning products, especially those that contain ammonia. This mixture can create a dangerous gas that may be deadly. Keep proper movement of fresh air in your home (ventilation). Pour used mop water down the utility sink or toilet. Do  not pour this water down the kitchen sink. Objects and surfaces  If surfaces are visibly soiled, clean them first with soap and water before disinfecting. Disinfect surfaces that are frequently touched every day. This may include: Counters. Tables. Doorknobs. Sinks and faucets. Electronics, such as: Engineer, technical sales. Remote controls. Keyboards. Computers and tablets. Cleaning supplies Some cleaning supplies can breed germs. Take good care of them to prevent germs from spreading. To do this: Soak toilet brushes, mops, and sponges in bleach and water for 5 minutes after each use, or according to manufacturer's instructions. Wash reusable cleaning cloths and sanitize sponges after each use. Throw away disposable gloves after one use. Replace reusable utility gloves if they are cracked or torn or if they start to peel. Additional actions if you are sick If you live with other people:  Avoid close contact with those around you. Stay at least 3 ft (1 m) away from others, if possible. Use a separate bathroom, if possible. If possible, sleep in a separate bedroom or in a separate bed to prevent infecting other household members. Change bedroom linens each week or whenever they are soiled. Have everyone in the household wash hands often with soap and water for at least 20 seconds. If soap and water are not available, use alcohol-based hand sanitizer. In general: Stay home except to get medical care. Call ahead before visiting your health care provider. Ask others to get groceries and household supplies and to refill prescriptions for you. Avoid public areas. Try not to take public transportation. If you can, wear a mask if you need to go out of the house, or if you are in close contact with someone who is not sick. Avoid visitors until you have completely recovered, or until you have no signs and symptoms of infection. Avoid preparing food or providing care for others. If you must prepare food or  provide care for others, wear a mask and wash your hands before and after doing these things. Where to find more information Centers for Disease Control and Prevention: [employer]no Summary It is important to know how to keep infection from spreading. Make sure everyone in your household washes their hands often with soap and water. Disinfect surfaces that are frequently touched every day. If you are sick, stay home except to get medical care. This information is not intended to replace advice given to you by your health care provider. Make sure you discuss any questions you have with your health care provider. Document Revised: 06/23/2021 Document Reviewed: 06/23/2021 Elsevier Patient Education  2024 [employer]vier [employer].   If you have been instructed to  have an in-person evaluation today at a local Urgent Care facility, please use the link below. It will take you to a list of all of our available [facility], including address, phone number and hours of operation. Please do not delay care.  [facility]  If you or a family member do not have a primary care provider, use the link below to schedule a visit and establish care. When you choose a [facility] primary care physician or advanced practice provider, you gain a long-term partner in health. Find a Primary Care Provider  Learn more about [facility]'s in-office and virtual care options: [facility] - Get Care Now

## 2023-08-22 NOTE — Progress Notes (Signed)
 Virtual Visit Consent   Brandon [name], you are scheduled for a virtual visit with a [facility] provider today. Just as with appointments in the office, your consent must be obtained to participate. Your consent will be active for this visit and any virtual visit you may have with one of our providers in the next 365 days. If you have a MyChart account, a copy of this consent can be sent to you electronically.  As this is a virtual visit, video technology does not allow for your provider to perform a traditional examination. This may limit your provider's ability to fully assess your condition. If your provider identifies any concerns that need to be evaluated in person or the need to arrange testing (such as labs, EKG, etc.), we will make arrangements to do so. Although advances in technology are sophisticated, we cannot ensure that it will always work on either your end or our end. If the connection with a video visit is poor, the visit may have to be switched to a telephone visit. With either a video or telephone visit, we are not always able to ensure that we have a secure connection.  By engaging in this virtual visit, you consent to the provision of healthcare and authorize for your insurance to be billed (if applicable) for the services provided during this visit. Depending on your insurance coverage, you may receive a charge related to this service.  I need to obtain your verbal consent now. Are you willing to proceed with your visit today? Brandon [name] has provided verbal consent on 08/22/2023 for a virtual visit (video or telephone). Roney Jaffe, PA-C  Date: 08/22/2023 10:35 AM   Virtual Visit via Video Note   I, Brandon [name], connected with  Brandon [name]  ([id number], 1965/10/05) on 08/22/23 at 10:30 AM EDT by a video-enabled telemedicine application and verified that I am speaking with the correct person using two identifiers.  Location: Patient: Virtual Visit Location  Patient: Home Provider: Virtual Visit Location Provider: Home Office   I discussed the limitations of evaluation and management by telemedicine and the availability of in person appointments. The patient expressed understanding and agreed to proceed.    History of Present Illness: Brandon [name] is a 58 y.o. 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 He first noticed symptoms late Friday night, which included feeling achy and generally unwell. By the following day, symptoms had progressed to include a runny nose, cough, sore throat, headache, and body aches. He denies having a fever. He has been managing symptoms with over-the-counter cold medicine, specifically DayQuil. He has been able to maintain adequate hydration and nutrition.   Home Covid test positive   Problems: There are no active problems to display for this patient.   Allergies: No Known Allergies Medications:  Current Outpatient Medications:    benzonatate (TESSALON) 100 MG capsule, Take 1-2 caps PO TID PRN, Disp: 20 capsule, Rfl: 0   atorvastatin (LIPITOR) 10 MG tablet, Take 1 tablet (10 mg total) by mouth at bedtime., Disp: 30 tablet, Rfl: 5   atorvastatin (LIPITOR) 10 MG tablet, Take 1 tablet (10 mg total) by mouth at bedtime., Disp: 30 tablet, Rfl: 5   Cholecalciferol (VITAMIN D3) 50 MCG (2000 UT) TABS, Take 2,000 Units by mouth daily., Disp: 30 tablet, Rfl: 5   desonide (DESOWEN) 0.05 % lotion, Apply a small  amount to affected area twice a day, for seb. derm., Disp: 118 mL, Rfl: 3   diltiazem (CARTIA XT) 120 MG 24 hr capsule, Take 1 capsule (120 mg total) by mouth daily., Disp: 90 capsule, Rfl: 1   diltiazem (CARTIA XT) 120 MG 24 hr capsule, Take 1 capsule (120 mg total) by mouth daily., Disp: 90 capsule, Rfl: 1   metroNIDAZOLE (METROGEL) 0.75 % gel, Apply 1 Application topically 2 (two) times daily., Disp:  45 g, Rfl: 0   olmesartan-hydrochlorothiazide (BENICAR HCT) 20-12.5 MG tablet, Take 1 tablet by mouth daily., Disp: 90 tablet, Rfl: 1   olmesartan-hydrochlorothiazide (BENICAR HCT) 20-12.5 MG tablet, Take 1 tablet by mouth daily., Disp: 90 tablet, Rfl: 1   olmesartan-hydrochlorothiazide (BENICAR HCT) 20-12.5 MG tablet, Take 1 tablet by mouth daily., Disp: 90 tablet, Rfl: 1   QUEtiapine (SEROQUEL) 50 MG tablet, Take 0.5 tablets (25 mg total) by mouth daily to twice daily, Disp: 90 tablet, Rfl: 2   QUEtiapine (SEROQUEL) 50 MG tablet, Take 1/2 tablet (25 mg total) by mouth 1-2 times daily., Disp: 90 tablet, Rfl: 1   Sod Fluoride-Potassium Nitrate (SODIUM FLUORIDE 5000 SENSITIVE) 1.1-5 % GEL, Brush for 2 minutes at bedtime, spit and do not rinse for 30 minutes, Disp: 200 mL, Rfl: 2  Observations/Objective: Patient is well-developed, well-nourished in no acute distress.  Resting comfortably  at home.  Head is normocephalic, atraumatic.  No labored breathing.  Speech is clear and coherent with logical content.  Patient is alert and oriented at baseline.    Assessment and Plan: 1. COVID (Primary) - benzonatate (TESSALON) 100 MG capsule; Take 1-2 caps PO TID PRN  Dispense: 20 capsule; Refill: 0  Tested positive with moderate symptoms. Increased exposure risk due to occupation. Paxlovid considered but renal function test needed. Likely improving, last day for Paxlovid efficacy. Managing symptoms with DayQuil, caution advised due to cardiac medications.  - Advise symptomatic management with OTC medications. - Recommend 5-day isolation and mask for 5 additional days around others.  Follow Up Instructions: I discussed the assessment and treatment plan with the patient. The patient was provided an opportunity to ask questions and all were answered. The patient agreed with the plan and demonstrated an understanding of the instructions.  A copy of instructions were sent to the patient via MyChart unless  otherwise noted below.    The patient was advised to call back or seek an in-person evaluation if the symptoms worsen or if the condition fails to improve as anticipated.    Kasandra Knudsen Mayers, PA-C

## 2023-08-24 ENCOUNTER — Other Ambulatory Visit (HOSPITAL_COMMUNITY): Payer: Self-pay

## 2023-09-14 ENCOUNTER — Other Ambulatory Visit (HOSPITAL_COMMUNITY): Payer: Self-pay

## 2023-09-14 ENCOUNTER — Other Ambulatory Visit: Payer: Self-pay

## 2023-09-20 ENCOUNTER — Other Ambulatory Visit (HOSPITAL_COMMUNITY): Payer: Self-pay

## 2023-09-20 MED ORDER — ATORVASTATIN CALCIUM 10 MG PO TABS
10.0000 mg | ORAL_TABLET | Freq: Every evening | ORAL | 5 refills | Status: AC
  Filled 2023-09-20: qty 30, 30d supply, fill #0
  Filled 2023-11-05 – 2024-05-03 (×2): qty 30, 30d supply, fill #1
  Filled 2024-05-27: qty 30, 30d supply, fill #2

## 2023-10-06 DIAGNOSIS — M545 Low back pain, unspecified: Secondary | ICD-10-CM | POA: Diagnosis not present

## 2023-10-27 DIAGNOSIS — M545 Low back pain, unspecified: Secondary | ICD-10-CM | POA: Diagnosis not present

## 2023-11-04 ENCOUNTER — Other Ambulatory Visit (HOSPITAL_COMMUNITY): Payer: Self-pay

## 2023-11-04 ENCOUNTER — Other Ambulatory Visit: Payer: Self-pay

## 2023-11-04 DIAGNOSIS — I10 Essential (primary) hypertension: Secondary | ICD-10-CM | POA: Diagnosis not present

## 2023-11-04 DIAGNOSIS — I429 Cardiomyopathy, unspecified: Secondary | ICD-10-CM | POA: Diagnosis not present

## 2023-11-04 DIAGNOSIS — Z131 Encounter for screening for diabetes mellitus: Secondary | ICD-10-CM | POA: Diagnosis not present

## 2023-11-04 DIAGNOSIS — Z125 Encounter for screening for malignant neoplasm of prostate: Secondary | ICD-10-CM | POA: Diagnosis not present

## 2023-11-04 DIAGNOSIS — E559 Vitamin D deficiency, unspecified: Secondary | ICD-10-CM | POA: Diagnosis not present

## 2023-11-04 DIAGNOSIS — G4733 Obstructive sleep apnea (adult) (pediatric): Secondary | ICD-10-CM | POA: Diagnosis not present

## 2023-11-04 DIAGNOSIS — F419 Anxiety disorder, unspecified: Secondary | ICD-10-CM | POA: Diagnosis not present

## 2023-11-04 DIAGNOSIS — R7303 Prediabetes: Secondary | ICD-10-CM | POA: Diagnosis not present

## 2023-11-04 DIAGNOSIS — E7800 Pure hypercholesterolemia, unspecified: Secondary | ICD-10-CM | POA: Diagnosis not present

## 2023-11-04 DIAGNOSIS — Z0001 Encounter for general adult medical examination with abnormal findings: Secondary | ICD-10-CM | POA: Diagnosis not present

## 2023-11-04 MED ORDER — ATORVASTATIN CALCIUM 10 MG PO TABS
10.0000 mg | ORAL_TABLET | Freq: Every evening | ORAL | 5 refills | Status: AC
  Filled 2023-11-04: qty 30, 30d supply, fill #0
  Filled 2023-11-05: qty 90, 90d supply, fill #0
  Filled 2024-02-03: qty 90, 90d supply, fill #1

## 2023-11-04 MED ORDER — CHOLECALCIFEROL 50 MCG (2000 UT) PO TABS
1.0000 | ORAL_TABLET | Freq: Every day | ORAL | 5 refills | Status: AC
  Filled 2023-11-04: qty 30, 30d supply, fill #0
  Filled 2024-02-03: qty 30, 30d supply, fill #1
  Filled 2024-04-28: qty 30, 30d supply, fill #2
  Filled 2024-05-27: qty 30, 30d supply, fill #3

## 2023-11-04 MED ORDER — QUETIAPINE FUMARATE 50 MG PO TABS
25.0000 mg | ORAL_TABLET | Freq: Two times a day (BID) | ORAL | 2 refills | Status: AC
  Filled 2023-11-04: qty 90, 90d supply, fill #0
  Filled 2024-02-03: qty 90, 90d supply, fill #1
  Filled 2024-04-28: qty 90, 90d supply, fill #2

## 2023-11-04 MED ORDER — DILTIAZEM HCL ER COATED BEADS 120 MG PO CP24
120.0000 mg | ORAL_CAPSULE | Freq: Every day | ORAL | 1 refills | Status: DC
  Filled 2023-11-04 – 2023-12-06 (×2): qty 90, 90d supply, fill #0
  Filled 2024-03-02: qty 90, 90d supply, fill #1

## 2023-11-05 ENCOUNTER — Other Ambulatory Visit (HOSPITAL_COMMUNITY): Payer: Self-pay

## 2023-12-06 ENCOUNTER — Other Ambulatory Visit (HOSPITAL_COMMUNITY): Payer: Self-pay

## 2024-02-03 ENCOUNTER — Other Ambulatory Visit (HOSPITAL_COMMUNITY): Payer: Self-pay

## 2024-02-03 ENCOUNTER — Other Ambulatory Visit: Payer: Self-pay

## 2024-02-03 ENCOUNTER — Encounter: Payer: Self-pay | Admitting: Pharmacist

## 2024-03-02 ENCOUNTER — Other Ambulatory Visit (HOSPITAL_COMMUNITY): Payer: Self-pay

## 2024-03-02 ENCOUNTER — Other Ambulatory Visit: Payer: Self-pay

## 2024-03-02 MED ORDER — OLMESARTAN MEDOXOMIL-HCTZ 20-12.5 MG PO TABS
1.0000 | ORAL_TABLET | Freq: Every day | ORAL | 1 refills | Status: AC
  Filled 2024-03-02: qty 90, 90d supply, fill #0
  Filled 2024-06-12: qty 90, 90d supply, fill #1

## 2024-04-28 ENCOUNTER — Other Ambulatory Visit (HOSPITAL_COMMUNITY): Payer: Self-pay

## 2024-05-27 ENCOUNTER — Other Ambulatory Visit (HOSPITAL_COMMUNITY): Payer: Self-pay

## 2024-06-12 ENCOUNTER — Other Ambulatory Visit: Payer: Self-pay

## 2024-06-12 ENCOUNTER — Other Ambulatory Visit (HOSPITAL_COMMUNITY): Payer: Self-pay

## 2024-06-13 ENCOUNTER — Other Ambulatory Visit: Payer: Self-pay

## 2024-06-13 ENCOUNTER — Other Ambulatory Visit (HOSPITAL_COMMUNITY): Payer: Self-pay

## 2024-06-13 MED ORDER — DILTIAZEM HCL ER COATED BEADS 120 MG PO CP24
120.0000 mg | ORAL_CAPSULE | Freq: Every day | ORAL | 1 refills | Status: AC
  Filled 2024-06-13 (×2): qty 90, 90d supply, fill #0
